# Patient Record
Sex: Male | Born: 1935 | Race: White | Hispanic: No | Marital: Married | Smoking: Former smoker
Health system: Southern US, Community
[De-identification: ages and names within clinical notes are randomized; demographics above are authoritative.]

## PROBLEM LIST (undated history)

## (undated) DIAGNOSIS — H34212 Partial retinal artery occlusion, left eye: Secondary | ICD-10-CM

## (undated) DIAGNOSIS — R5381 Other malaise: Secondary | ICD-10-CM

## (undated) DIAGNOSIS — G252 Other specified forms of tremor: Secondary | ICD-10-CM

## (undated) DIAGNOSIS — E785 Hyperlipidemia, unspecified: Secondary | ICD-10-CM

## (undated) DIAGNOSIS — I4589 Other specified conduction disorders: Secondary | ICD-10-CM

## (undated) DIAGNOSIS — K4020 Bilateral inguinal hernia, without obstruction or gangrene, not specified as recurrent: Principal | ICD-10-CM

## (undated) DIAGNOSIS — G20 Parkinson's disease: Secondary | ICD-10-CM

## (undated) DIAGNOSIS — C4490 Unspecified malignant neoplasm of skin, unspecified: Secondary | ICD-10-CM

## (undated) DIAGNOSIS — R5383 Other fatigue: Secondary | ICD-10-CM

## (undated) DIAGNOSIS — I351 Nonrheumatic aortic (valve) insufficiency: Secondary | ICD-10-CM

## (undated) DIAGNOSIS — I10 Essential (primary) hypertension: Secondary | ICD-10-CM

## (undated) DIAGNOSIS — J069 Acute upper respiratory infection, unspecified: Secondary | ICD-10-CM

## (undated) DIAGNOSIS — I453 Trifascicular block: Secondary | ICD-10-CM

## (undated) DIAGNOSIS — I4891 Unspecified atrial fibrillation: Secondary | ICD-10-CM

## (undated) HISTORY — DX: Other specified forms of tremor: G25.2

## (undated) HISTORY — DX: Acute upper respiratory infection, unspecified: J06.9

## (undated) HISTORY — DX: Partial retinal artery occlusion, left eye: H34.212

## (undated) HISTORY — DX: Unspecified malignant neoplasm of skin, unspecified: C44.90

## (undated) HISTORY — DX: Parkinson's disease: G20

## (undated) HISTORY — DX: Bilateral inguinal hernia, without obstruction or gangrene, not specified as recurrent: K40.20

## (undated) HISTORY — DX: Other malaise: R53.81

## (undated) HISTORY — DX: Other fatigue: R53.83

## (undated) HISTORY — DX: Essential (primary) hypertension: I10

## (undated) HISTORY — DX: Nonrheumatic aortic (valve) insufficiency: I35.1

## (undated) HISTORY — DX: Trifascicular block: I45.3

## (undated) HISTORY — DX: Hyperlipidemia, unspecified: E78.5

## (undated) HISTORY — DX: Other specified conduction disorders: I45.89

---

## 1992-05-06 DIAGNOSIS — C4490 Unspecified malignant neoplasm of skin, unspecified: Secondary | ICD-10-CM

## 1992-05-06 HISTORY — DX: Unspecified malignant neoplasm of skin, unspecified: C44.90

## 1996-03-20 DIAGNOSIS — G20 Parkinson's disease: Secondary | ICD-10-CM

## 1996-03-20 HISTORY — DX: Parkinson's disease: G20

## 1999-08-25 HISTORY — PX: OTHER SURGICAL HISTORY: SHX169

## 2000-04-08 ENCOUNTER — Encounter: Admission: RE | Admit: 2000-04-08 | Discharge: 2000-04-08 | Payer: Self-pay

## 2000-05-25 ENCOUNTER — Encounter (INDEPENDENT_AMBULATORY_CARE_PROVIDER_SITE_OTHER): Payer: Self-pay

## 2000-05-25 ENCOUNTER — Ambulatory Visit (HOSPITAL_COMMUNITY): Admission: RE | Admit: 2000-05-25 | Discharge: 2000-05-25 | Payer: Self-pay | Admitting: *Deleted

## 2000-08-30 ENCOUNTER — Encounter: Payer: Self-pay | Admitting: Internal Medicine

## 2000-08-30 ENCOUNTER — Encounter: Admission: RE | Admit: 2000-08-30 | Discharge: 2000-08-30 | Payer: Self-pay | Admitting: Internal Medicine

## 2002-02-17 ENCOUNTER — Encounter: Payer: Self-pay | Admitting: Specialist

## 2002-02-24 ENCOUNTER — Inpatient Hospital Stay (HOSPITAL_COMMUNITY): Admission: RE | Admit: 2002-02-24 | Discharge: 2002-03-01 | Payer: Self-pay | Admitting: Specialist

## 2002-02-24 HISTORY — PX: TOTAL KNEE ARTHROPLASTY: SHX125

## 2002-04-03 ENCOUNTER — Encounter: Admission: RE | Admit: 2002-04-03 | Discharge: 2002-04-26 | Payer: Self-pay | Admitting: Specialist

## 2002-07-17 ENCOUNTER — Encounter: Admission: RE | Admit: 2002-07-17 | Discharge: 2002-08-09 | Payer: Self-pay | Admitting: Specialist

## 2002-09-29 ENCOUNTER — Ambulatory Visit (HOSPITAL_COMMUNITY): Admission: RE | Admit: 2002-09-29 | Discharge: 2002-09-29 | Payer: Self-pay | Admitting: *Deleted

## 2002-09-29 ENCOUNTER — Encounter (INDEPENDENT_AMBULATORY_CARE_PROVIDER_SITE_OTHER): Payer: Self-pay | Admitting: Specialist

## 2003-02-20 ENCOUNTER — Inpatient Hospital Stay (HOSPITAL_COMMUNITY): Admission: RE | Admit: 2003-02-20 | Discharge: 2003-02-25 | Payer: Self-pay | Admitting: Specialist

## 2003-02-20 HISTORY — PX: REPLACEMENT TOTAL KNEE: SUR1224

## 2003-03-19 ENCOUNTER — Encounter: Admission: RE | Admit: 2003-03-19 | Discharge: 2003-05-28 | Payer: Self-pay | Admitting: Specialist

## 2003-07-19 ENCOUNTER — Ambulatory Visit (HOSPITAL_COMMUNITY): Admission: RE | Admit: 2003-07-19 | Discharge: 2003-07-19 | Payer: Self-pay | Admitting: Internal Medicine

## 2004-01-10 ENCOUNTER — Ambulatory Visit (HOSPITAL_COMMUNITY): Admission: RE | Admit: 2004-01-10 | Discharge: 2004-01-10 | Payer: Self-pay | Admitting: Cardiology

## 2004-03-10 ENCOUNTER — Ambulatory Visit (HOSPITAL_COMMUNITY): Admission: RE | Admit: 2004-03-10 | Discharge: 2004-03-10 | Payer: Self-pay | Admitting: Internal Medicine

## 2004-05-05 ENCOUNTER — Encounter: Admission: RE | Admit: 2004-05-05 | Discharge: 2004-05-05 | Payer: Self-pay | Admitting: *Deleted

## 2004-05-21 ENCOUNTER — Other Ambulatory Visit: Admission: RE | Admit: 2004-05-21 | Discharge: 2004-05-21 | Payer: Self-pay | Admitting: Diagnostic Radiology

## 2004-05-21 ENCOUNTER — Encounter: Admission: RE | Admit: 2004-05-21 | Discharge: 2004-05-21 | Payer: Self-pay | Admitting: *Deleted

## 2004-05-21 ENCOUNTER — Encounter (INDEPENDENT_AMBULATORY_CARE_PROVIDER_SITE_OTHER): Payer: Self-pay | Admitting: Specialist

## 2004-05-26 ENCOUNTER — Encounter: Admission: RE | Admit: 2004-05-26 | Discharge: 2004-05-26 | Payer: Self-pay | Admitting: Internal Medicine

## 2004-06-18 ENCOUNTER — Encounter: Admission: RE | Admit: 2004-06-18 | Discharge: 2004-06-18 | Payer: Self-pay | Admitting: Specialist

## 2004-08-08 ENCOUNTER — Ambulatory Visit: Payer: Self-pay | Admitting: Physical Medicine & Rehabilitation

## 2004-08-08 ENCOUNTER — Ambulatory Visit: Payer: Self-pay | Admitting: Internal Medicine

## 2004-08-08 ENCOUNTER — Encounter (INDEPENDENT_AMBULATORY_CARE_PROVIDER_SITE_OTHER): Payer: Self-pay | Admitting: Specialist

## 2004-08-08 ENCOUNTER — Inpatient Hospital Stay (HOSPITAL_COMMUNITY): Admission: RE | Admit: 2004-08-08 | Discharge: 2004-08-12 | Payer: Self-pay | Admitting: Orthopedic Surgery

## 2004-08-08 HISTORY — PX: REVISION TOTAL KNEE ARTHROPLASTY: SHX767

## 2004-08-12 ENCOUNTER — Inpatient Hospital Stay (HOSPITAL_COMMUNITY)
Admission: RE | Admit: 2004-08-12 | Discharge: 2004-08-15 | Payer: Self-pay | Admitting: Physical Medicine & Rehabilitation

## 2004-08-16 ENCOUNTER — Encounter (HOSPITAL_COMMUNITY): Admission: RE | Admit: 2004-08-16 | Discharge: 2004-09-25 | Payer: Self-pay | Admitting: Orthopedic Surgery

## 2004-09-18 HISTORY — PX: REVISION TOTAL KNEE ARTHROPLASTY: SHX767

## 2004-09-25 ENCOUNTER — Inpatient Hospital Stay (HOSPITAL_COMMUNITY): Admission: RE | Admit: 2004-09-25 | Discharge: 2004-09-29 | Payer: Self-pay | Admitting: Orthopedic Surgery

## 2004-10-19 ENCOUNTER — Inpatient Hospital Stay (HOSPITAL_COMMUNITY): Admission: EM | Admit: 2004-10-19 | Discharge: 2004-10-24 | Payer: Self-pay | Admitting: Emergency Medicine

## 2005-04-27 IMAGING — CR DG CHEST 2V
2 series · 2 of 2 positions shown · non-contrast
Comparison: 08/04/04.

CLINICAL DATA: 77-year-old for preop total knee replacement. 
 CHEST - 2 VIEW:

[view not recorded (1 of 2)]
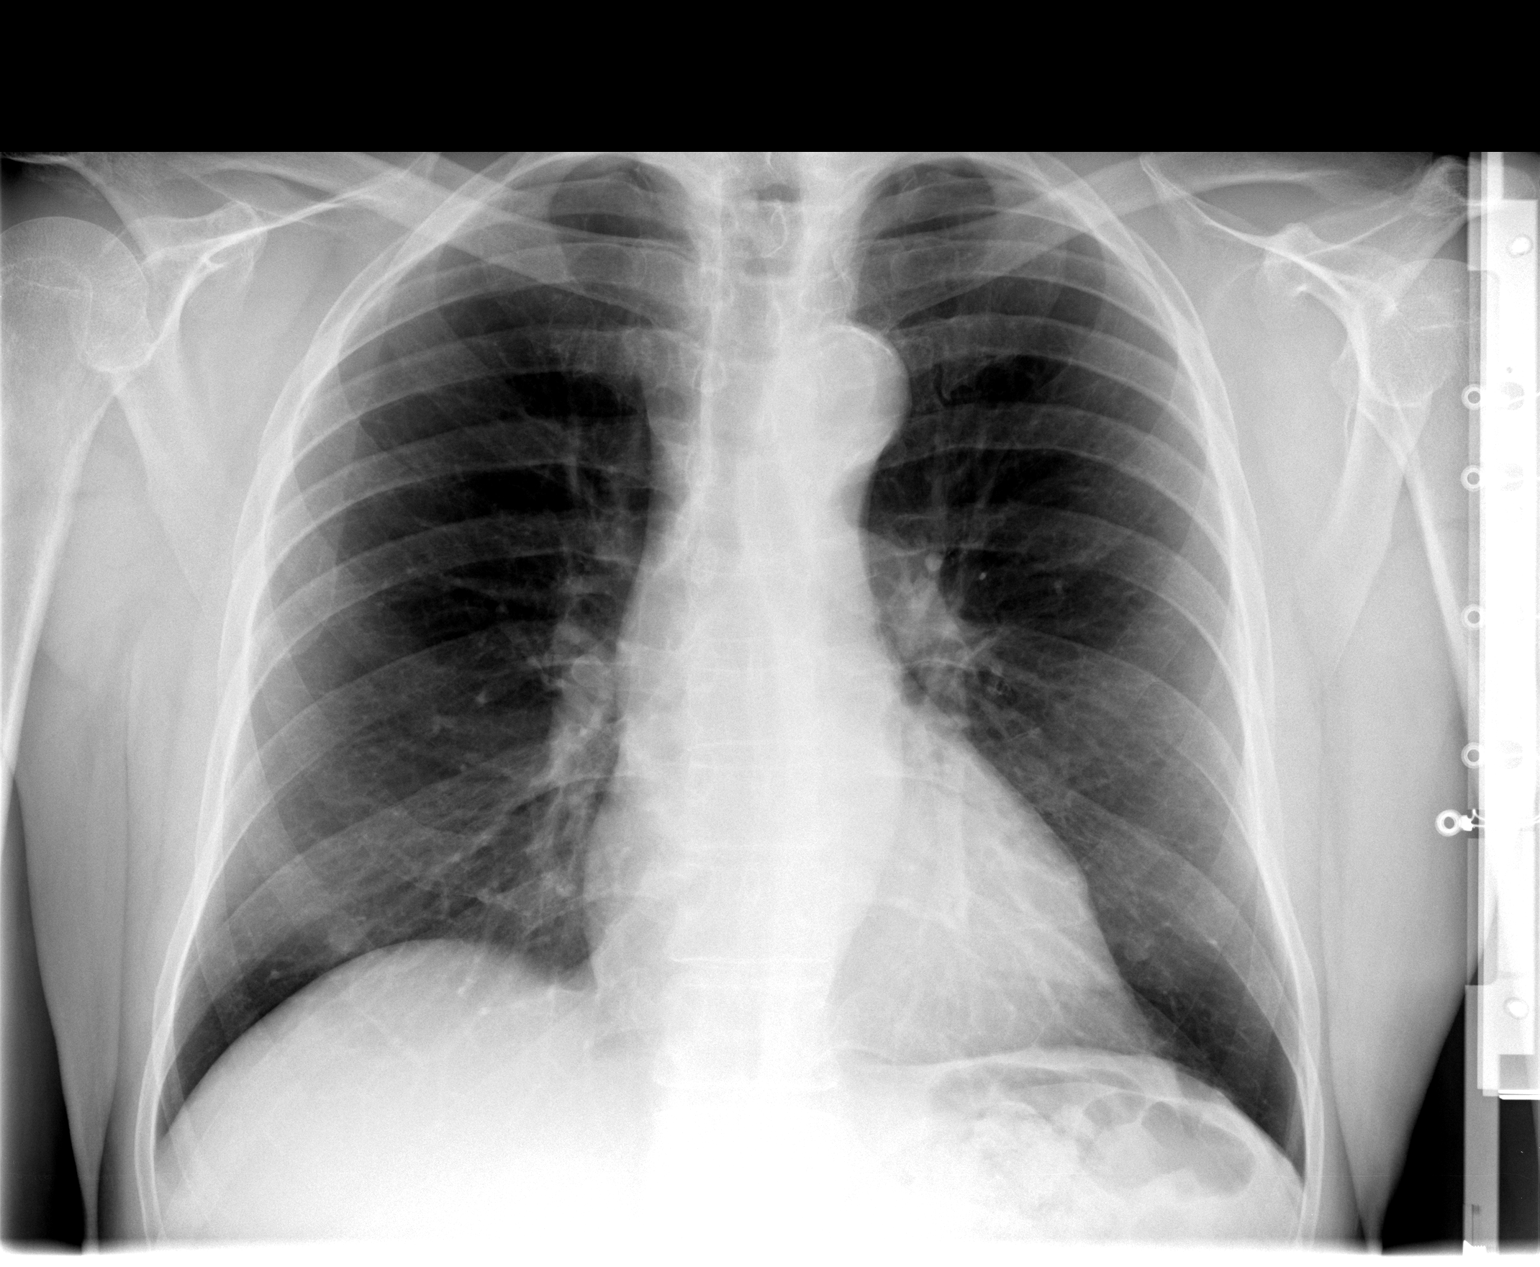

[view not recorded (2 of 2)]
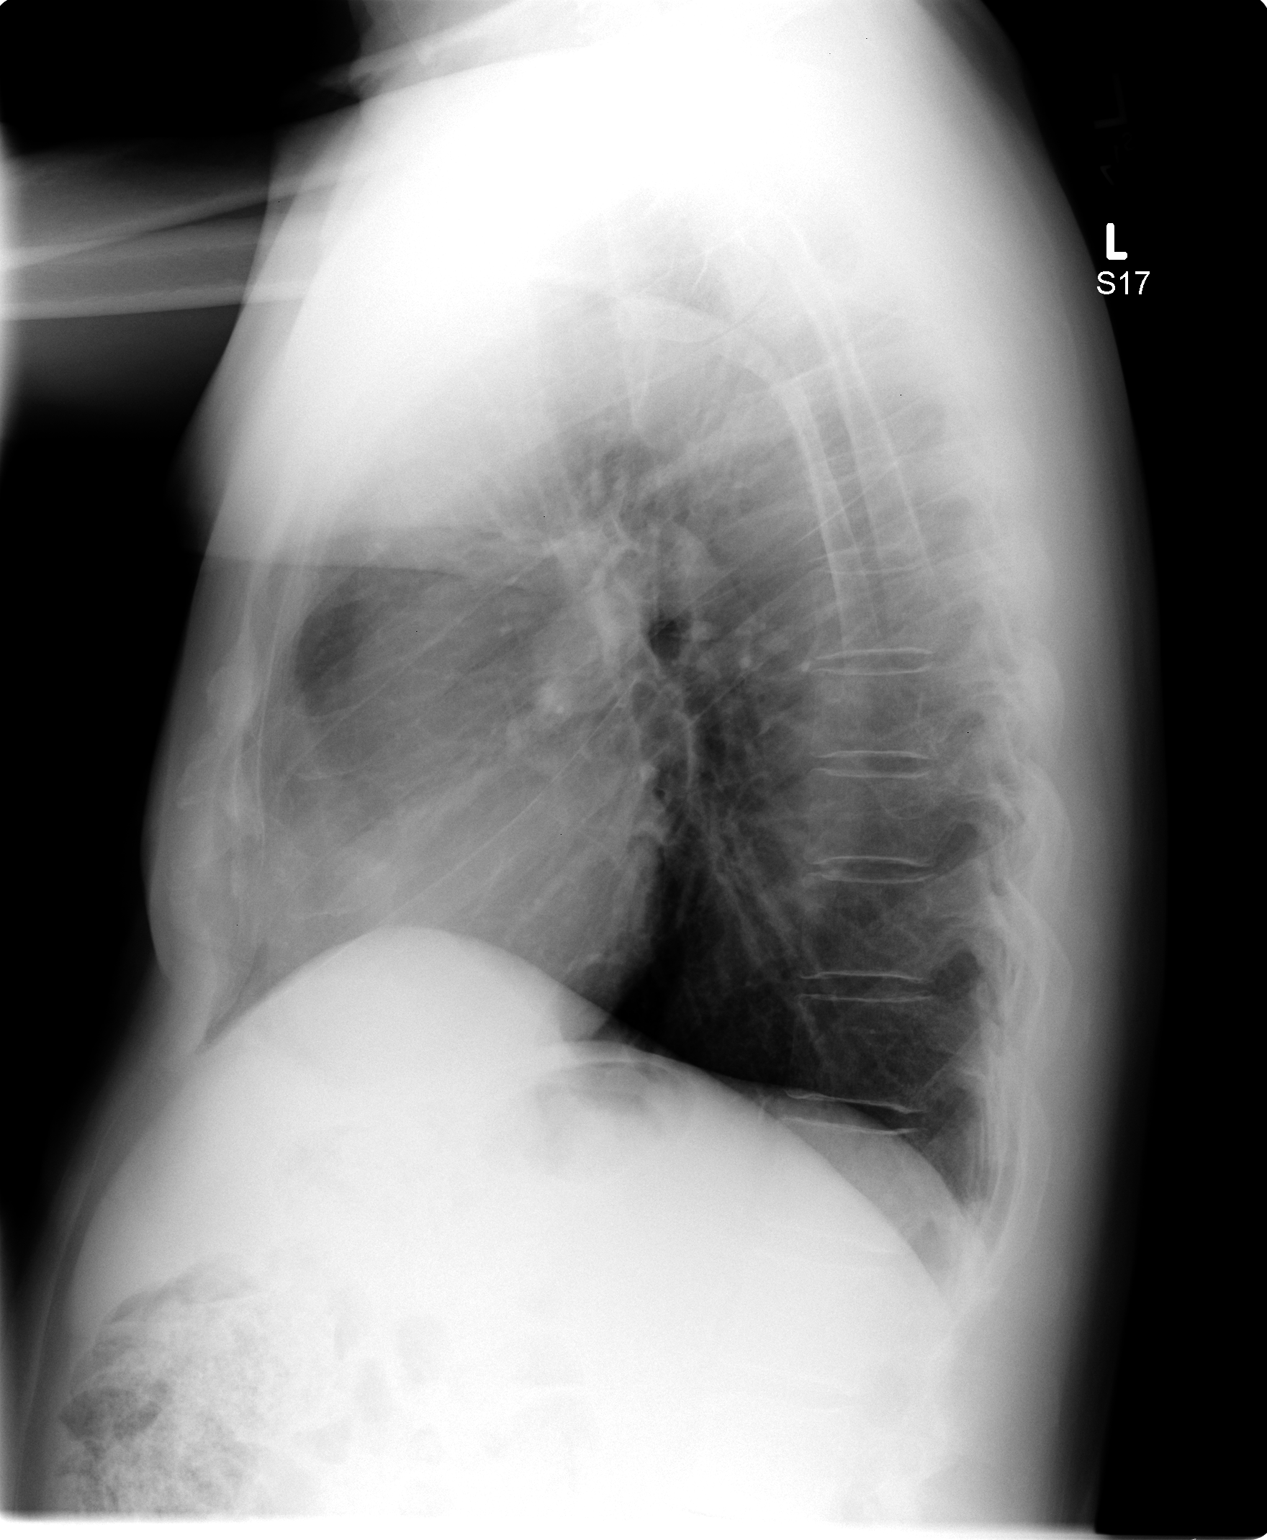

[2 of 2 positions shown; findings below may reference images not displayed]

Two views of the chest show the cardiac silhouette and mediastinal contours to be within normal limits and stable.  Lungs are clear.  Two rounded densities in the lower lung zones bilaterally are fairly symmetric and likely represent nipple shadows.
IMPRESSION: No acute cardiopulmonary findings.

## 2005-12-14 ENCOUNTER — Encounter (INDEPENDENT_AMBULATORY_CARE_PROVIDER_SITE_OTHER): Payer: Self-pay | Admitting: *Deleted

## 2005-12-14 ENCOUNTER — Ambulatory Visit (HOSPITAL_COMMUNITY): Admission: RE | Admit: 2005-12-14 | Discharge: 2005-12-14 | Payer: Self-pay | Admitting: *Deleted

## 2006-07-02 ENCOUNTER — Encounter: Admission: RE | Admit: 2006-07-02 | Discharge: 2006-07-02 | Payer: Self-pay | Admitting: Neurology

## 2006-12-01 ENCOUNTER — Ambulatory Visit (HOSPITAL_COMMUNITY): Admission: RE | Admit: 2006-12-01 | Discharge: 2006-12-01 | Payer: Self-pay | Admitting: *Deleted

## 2006-12-01 ENCOUNTER — Encounter (INDEPENDENT_AMBULATORY_CARE_PROVIDER_SITE_OTHER): Payer: Self-pay | Admitting: *Deleted

## 2008-03-06 HISTORY — PX: OTHER SURGICAL HISTORY: SHX169

## 2008-04-21 ENCOUNTER — Emergency Department (HOSPITAL_BASED_OUTPATIENT_CLINIC_OR_DEPARTMENT_OTHER): Admission: EM | Admit: 2008-04-21 | Discharge: 2008-04-21 | Payer: Self-pay | Admitting: Emergency Medicine

## 2010-09-02 NOTE — Op Note (Signed)
[name], FAILS NO.:  [id number]   MEDICAL RECORD NO.:  [id number]          PATIENT TYPE:  AMB   LOCATION:  ENDO                         FACILITY:  [facility]   PHYSICIAN:  Georgiana Spinner, M.D.    DATE OF BIRTH:  [birth year]   DATE OF PROCEDURE:  12/01/2006  DATE OF DISCHARGE:                               OPERATIVE REPORT   PROCEDURE:  Upper endoscopy with biopsy.   INDICATIONS:  Gastroesophageal reflux disease, rule out Barrett's  esophagus.   ANESTHESIA:  Fentanyl 50 mcg, Versed 5 mg.   PROCEDURE:  With the patient mildly sedated in the left lateral  decubitus position the Pentax videoscopic endoscope was inserted in the  mouth and passed under direct vision through the esophagus which  appeared normal except for one small area of Barrett's photographed and  biopsied.  We then entered into the stomach.  Fundus, body, antrum,  duodenal bulb, second portion duodenum all appeared normal.  From this  point the endoscope was slowly withdrawn taking circumferential views of  the duodenal mucosa until the endoscope had been pulled back in the  stomach, placed in retroflexion to view the stomach from below.  The  endoscope was straightened and withdrawn taking circumferential views of  remaining gastric and esophageal mucosa.  The patient's vital signs,  pulse oximeter remained stable.  The patient tolerated procedure well  without apparent complications.   FINDINGS:  One [address] of Barrett's esophagus biopsied.  Await  biopsy report.  The patient will call me for results and follow-up with  me as an outpatient.           ______________________________  Georgiana Spinner, M.D.     GMO/MEDQ  D:  12/01/2006  T:  12/02/2006  Job:  [id number]

## 2010-09-05 NOTE — Op Note (Signed)
   NAME:  [name], [name]                       ACCOUNT NO.:  [id number]   MEDICAL RECORD NO.:  [id number]                   PATIENT TYPE:  AMB   LOCATION:  ENDO                                 FACILITY:  [facility]   PHYSICIAN:  Georgiana Spinner, M.D.                 DATE OF BIRTH:  10/17/1926   DATE OF PROCEDURE:  DATE OF DISCHARGE:                                 OPERATIVE REPORT   PROCEDURE:  Colonoscopy.   INDICATIONS FOR PROCEDURE:  Colon polyps.   ANESTHESIA:  Demerol 20, Versed 2 mg.   DESCRIPTION OF PROCEDURE:  With the patient mildly sedated in the left  lateral decubitus position, the Olympus videoscopic colonoscope was inserted  in the rectum after a normal rectal exam and passed under direct vision to  the cecum identified by the ileocecal valve and appendiceal orifice both of  which were photographed. From this point, the colonoscope was slowly  withdrawn taking circumferential views of the entire colonic mucosa stopping  only in ascending colon just 1-2 folds removed from the ileocecal valve at  which point a polyp was seen and was seen to be oblong in shape and attached  by a short pedicle approximately 1 cm in size. this was photographed and  removed using snare cautery technique on a setting of 20:20 blended current.  Some residual polypoid tissue was also then snared off and also hot biopsy  forceps was used to remove the last of tissue and to cauterize the base.  There was good hemostasis. All of the tissue was retrieved for pathology.  The endoscope was then withdrawn taking circumferential views of the  remaining colonic mucosa stopping only then in the rectum which appeared  normal on direct and retroflexed view.  The endoscope was straightened and  withdrawn. The patient's vital signs and pulse oximeter remained stable. The  patient tolerated the procedure well without apparent complications.   FINDINGS:  Diverticulosis of the sigmoid colon moderately severe, also  polyp  of ascending colon removed. Await biopsy report. The patient will call me  for results and followup with me as an outpatient.                                               Georgiana Spinner, M.D.    GMO/MEDQ  D:  09/29/2002  T:  09/29/2002  Job:  [id number]

## 2010-09-05 NOTE — H&P (Signed)
NAMEJAXON, FLATT NO.:  [id number]   MEDICAL RECORD NO.:  [id number]          PATIENT TYPE:  INP   LOCATION:  3025                         FACILITY:  [facility]   PHYSICIAN:  Lonia Blood, M.D.      DATE OF BIRTH:  Dec 22, 1926   DATE OF ADMISSION:  10/19/2004  DATE OF DISCHARGE:                                HISTORY & PHYSICAL   PHYSICIANS:  Primary care physician: Janae Bridgeman. Lendell Caprice, M.D.  Neurologist:  Genene Churn. Love, M.D.  Orthopedic surgeon: Madlyn Frankel. Charlann Boxer, M.D.   PRESENTING COMPLAINT:  Intermittent delirium and falls.   HISTORY OF PRESENT ILLNESS:  The patient is a 77 year old with history of  coronary artery disease, Parkinson's disease, as well as status post knee  replacement surgery bilaterally with a recent revision within a month.  The  patient was brought in by family secondary to falling twice yesterday.  Over  the past three to four days, the patient has been acting weirdly and very  restless and tremulous.  He apparently went to bed yesterday, and somehow in  the middle of the night fell out of bed.  He was found on the floor around 6  a.m. today.  The patient has been in and out of consciousness, sometimes  very sharp with excellent memory; however, he goes into episodes of delirium  where he is shaking uncontrollably and not communicating adequately.  He  denied any fever, nausea, vomiting, or diarrhea.  Per family, his knee has  been completely swollen, and he has been complaining of pain there. He just  completed 10 days of oral Keflex.   PAST MEDICAL HISTORY:  1.  Parkinson's disease.  2.  Severe arthritis status post right total knee replacement in July 2003      which was reviewed in June 2006.  3.  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  4.  History of coronary artery disease status post PTCA in 1988.  5.  GERD.  6.  Hypertension.  7.  Diverticulosis with episode of diverticulitis.  8.  Dyslipidemia.  9.  Normocytic  anemia.   MEDICATIONS:  1.  Methocarbamol 500 mg 1 tablet q.6h. p.r.n. for muscle spasm.  2.  Coumadin 5 mg daily prescribed for DVT prophylaxis after total knee      replacement.  He is to take it up until October 25, 2004.  3.  Vicodin 5/500 one to two tablets q.4-6h. p.r.n.  4.  Nexium 40 mg daily.  5.  Dilacor XR 120 mg daily.  6.  Amantadine 100 mg p.o. t.i.d.   ALLERGIES:  The patient is allergic to CODEINE SULFATE.   SOCIAL HISTORY:  The patient lives in [place] with his wife in a one-  level home.  Prior to this episode, he was able to walk with a cane.  He had  no history of tobacco or alcohol use.  He has other assistance from family  members including daughter who lives nearby.   FAMILY HISTORY:  Noncontributory at this point.   REVIEW OF SYSTEMS:  A 10-point Review of Systems was attempted.  The patient  is unable to give adequate history but mainly complained as indicated.   PHYSICAL EXAMINATION:  VITAL SIGNS:  The patient is afebrile with a  temperature of 98.3, blood pressure 121/67, pulse 76, respiratory rate 22,  O2 saturation 97% on room air.  Orthostatic blood pressure also checked, and  the patient was not orthostatic.  GENERAL:  Tremulous which is episodic.  In between, the patient is  communicating well and has very sharp memory.  He is in no acute distress.  HEENT:  Pupils equal, round, and reactive to light.  The patient wears  glasses.  NECK:  Supple, no JVD, no lymphadenopathy, no carotid bruit.  RESPIRATORY:  Good air entry bilaterally with no wheezes or rales.  CARDIOVASCULAR:  Regular rate and rhythm.  ABDOMEN: Soft, nontender, with positive bowel sounds.  EXTREMITIES:  No clubbing, cyanosis, or edema.  NEUROLOGIC:  Extensive examination was done by Dr. Sandria Manly in my presence.  Cranial nerves II-XII intact.  The patient has good motor function but has  cogwheel rigidity as expected.  He has both intention and resting tremors  but more intention than  rest tremors.  Reflexes are 2+ bilaterally.  He has  relatively normal coordination except for the tremors as indicated.  His  gait, however, is affected.  The patient has been falling backward which is  consistent with retropulsion.  But this could be caused by Parkinson's  disease among other causes.   LABORATORY DATA:  His labs at this point showed a white count of 6.5,  hemoglobin 11.7.  Previous hemoglobin 12.1 in June 2006.  Platelet count is  338, normal differential.  Sodium 132, potassium 4, CO2 26, glucose 96, BUN  24, creatinine 0.9, calcium 8.8, total protein 6.7, albumin 3.8, AST 18, ALT  12, alkaline phosphatase 141, and total bilirubin 0.4.  UA is clean.  PT  19.1, INR 1.6.   Head CT without contrast is negative for any acute illness.   ASSESSMENT:  This is a 77 year old gentleman with sudden delirium, also has  had falls probably secondary to effects of delirium.  The patient also has  had recent surgery and has markedly swollen right knee where he had the knee  replacement.  It was warm to touch, tender, and looks like it may have been  infected.  He just finished a dose of Keflex, however.  It seems to me like  the issue of delirium is drug related rather than anything else.  The most  likely drug could be a combination of amantadine and methocarbamol probably  potentiating the effects.  Other possibilities are amantadine alone which is  a toxic drug, but if it is toxic to the patient or if he is withdrawing from  the drug.  At this time, there is no way of telling.  I have asked Dr. Sandria Manly,  therefore, to assist [place] in determining the likely source of patient's  delirium.  Other possibilities could be a myocardial infarction with his  history of coronary artery disease, but patient has no chest pain and no  real EKG changes.  His EKG shows merely normal sinus rhythm with some  evidence of left axis deviation, right bundle branch block, but this is not  acute.  PLAN:   1.  Delirium:  Will admit the patient to monitored bed.  We will check the      dose of his amantadine as part of first recommendation.  Will start with  the liquid form at 15 mg t.i.d.  If we do not have the liquid form in      the hospital, will switch to 100 mg b.i.d. to start.  This is because we      cannot stop the amantadine cold [place].  I will also stop Robaxin that      he is taking at this point.  Will see if his mentation improves with      these new changes.  I will also get PT and OT involved with the patient      and hope they help him with his gait.  I suspect his knee issue is also      going to be a problem, and I will try and address that as well.  2.  Right knee pain and swelling.  I am worried that patient may have had      more infection than we originally thought.  Currently he is doing okay      on the workup when we stood him up except for the retropulsion that I      mentioned.  I will CT his knee since we cannot do an MRI to see if maybe      there is a fluid collection there that may be consistent with an      abscess.  He is not febrile at this point, and I do will not imagine      workup.  I will not imagine any further workup like blood cultures at      this point.  There is no discharge from the area, although the patient      had serous discharge apparently two days ago.  If patient spikes a fever      or if there is no improvement, I will consider adding antibiotic while      he is in the hospital.  Meanwhile, I will continue pan culture with      Vicodin.  3.  Gastroesophageal reflux disease.  I will start him on Nexium which he      has been taking and probably switch to Protonix in the hospital.  4.  Coronary artery disease.  This seems to be stable; however, if he is      having myocardial infarction, he could have altered mental status with      delirium.  I will, therefore, check his cardiac enzymes serially to rule      out myocardial infarction  at this stage.  5.  Anemia.  The patient's hemoglobin is stable close to where his discharge      hemoglobin was.  It was apparently postoperative anemia.  6.  Hypertension.  I will continue with Diltiazem at this stage.  His blood      pressure seems to be controlled now.       LG/MEDQ  D:  10/19/2004  T:  10/19/2004  Job:  [id number]   cc:   Janae Bridgeman. Eloise Harman., M.D.  [address]  [place]  [place] [address]  Fax: [phone]   Genene Churn. Love, M.D.  [address]  [address]  [place]  [place] [address]  Fax: [phone]   Madlyn Frankel. Charlann Boxer, M.D.  [facility]  [address]  [place] [address]  [place]  [place] [address]  Fax: [phone]

## 2010-09-05 NOTE — Discharge Summary (Signed)
[name], [name] NO.:  [id number]   MEDICAL RECORD NO.:  [id number]          PATIENT TYPE:  IPS   LOCATION:  4039                         FACILITY:  [facility]   PHYSICIAN:  Ellwood Dense, M.D.   DATE OF BIRTH:  [birth year]   DATE OF ADMISSION:  08/12/2004  DATE OF DISCHARGE:  08/15/2004                                 DISCHARGE SUMMARY   DISCHARGE DIAGNOSES:  1.  Status post right knee revision secondary to infection, August 08, 2004.  2.  Intravenous vancomycin x6 weeks.  3.  Coumadin for deep venous thrombosis prophylaxis.  4.  Anemia.  5.  Parkinson's disease.  6.  Hypertension.  7.  Gastroesophageal reflux disease.  8.  Hyperlipidemia.  9.  Coronary artery disease with percutaneous transluminal coronary      angioplasty.  10. Left total knee replacement, November, 2004.   HISTORY OF PRESENT ILLNESS:  This is a 77 year old white male with history  of right total knee replacement February 24, 2002, who was admitted to [facility] on April 21 with right knee pain. Aspirin of knee was  negative. Sedimentation rate 40, felt to be aseptic loosening, question  infectious process. He underwent right knee resection of right total knee  replacement on August 08, 2004 per Dr. Charlann Boxer. Placed on Coumadin for deep  venous thrombosis prophylaxis; touch down weight bearing. He was fitted with  a locked knee hinged brace. Infectious disease follow up, placed on  intravenous vancomycin x6 weeks.  A PICC line had been placed August 11, 2004. He was admitted for  comprehensive rehabilitation program.   PAST MEDICAL HISTORY:  See discharge diagnoses.   ALLERGIES:  CODEINE.   SOCIAL HISTORY:  He lives with his wife in a 2-level home, bedroom  downstairs. Wife can assist on discharge.   MEDICATIONS PRIOR TO ADMISSION:  1.  Nexium 40 mg daily.  2.  Amantadine 100 mg t.i.d.  3.  Dilacor XR 120 mg daily.  4.  Mobic 15 mg daily.   HOSPITAL COURSE:  The patient did  well while on rehabilitation services with  therapies initiated on a b.i.d. basis. The following issues were followed  during patient's rehab course:   1.  Pertaining to Mr. Jutte, right knee resection secondary to infection      August 08, 2004. Surgical site healing nicely with dressing changes per      Dr. Durene Romans. Neurovascular sensation intact. Touch down weight      bearing to right lower extremity. He has been fitted with a locked      hinged knee brace as per orthopedic services. He remained on intravenous      vancomycin x6 weeks. A PICC line had been placed for access of      antibiotics. He would follow up with infectious disease, Dr. Lenn Sink. Arrangements had been made for patient to be followed by a      nurse at home for administering antibiotic care. He remained on Coumadin      for deep venous thrombosis prophylaxis; latest INR of 3.0.  He would      receive Home Health Nursing to complete Coumadin protocol.   1.  Postoperative anemia. Hemoglobin of 9.6. He remained on iron supplement.  2.  His Parkinson's disease was without issue during his rehabilitation      stay. He remained on Amantadine t.i.d. He will follow up with Dr. Sandria Manly,      neurology services as advised.  3.  His blood pressures remained controlled with diastolic pressures 60 to      75. He continued with his Cardizem as advised.  4.  GI:  He had no bowel or bladder disturbances throughout his rehab      course.  5.  Noted history of left total knee replacement November, 2004, this again      was without issue during his rehab course. The patient was ambulating      extended household distances with a walker at time of discharge.  Full      family teaching completed. Home Health therapies will be provided as      advised per rehab services.   LATEST LABS:  Showed an INR of 3.0. Sodium 137, potassium 3.3, BUN 16,  creatinine 0.8. Hemoglobin 9.6, hematocrit 27.6, platelets 331,000, WBC  of  6000.   He was discharged to home.   DISCHARGE MEDICATIONS AT TIME OF DICTATION:  1.  Coumadin, latest dose of 7.5 mg, this will be adjusted on the day of      discharge, to be completed on Sep 07, 2004.  2.  Amantadine 100 mg t.i.d.  3.  Dilacor 120 mg daily.  4.  Protonix 40 mg daily.  5.  Trinsicon 1 capsule b.i.d.  6.  Vicodin p.r.Johnny. pain.  7.  Intravenous vancomycin x6 weeks total from the date of August 08, 2004;      to be followed up per Dr. Durene Romans of orthopedic services.   ACTIVITY:  Touch down weight bearing to right lower extremity with locked  knee brace.   SPECIAL INSTRUCTIONS:  Home Health nurse for administration intravenous  vancomycin antibiotic. Home Health nurse to complete Coumadin protocol.      DA/MEDQ  D:  08/14/2004  T:  08/14/2004  Job:  [id number]   cc:   Madlyn Frankel Charlann Boxer, M.D.  [facility]  [address]  [place] [address]  [place]  [place] [address]  Fax: [phone]   Rockey Situ. Flavia Shipper., M.D.  [address]  [place]  [place] [address]  Fax: [phone]   Janae Bridgeman. Eloise Harman., M.D.  [address] [place] [address]  [place]  [place] [address]  Fax: [phone]   Genene Churn. Love, M.D.  [address]  [address]  [place]  [place] [address]  Fax: [phone]

## 2010-09-05 NOTE — Discharge Summary (Signed)
NAMENYRON, MOZER NO.:  [id number]   MEDICAL RECORD NO.:  [id number]          PATIENT TYPE:  INP   LOCATION:  0467                         FACILITY:  [facility]   PHYSICIAN:  Madlyn Frankel. Charlann Boxer, M.D.  DATE OF BIRTH:  [birth year]   DATE OF ADMISSION:  08/08/2004  DATE OF DISCHARGE:  08/12/2004                                 DISCHARGE SUMMARY   ADMISSION DIAGNOSES:  1.  Failed right total knee replacement arthroplasty.  2.  Coronary artery disease, history of cardiac catheterization.  3.  History of angina.  4.  Gastroesophageal reflux disease.  5.  Parkinson disease.  6.  Diverticulosis.  7.  Hemorrhoids.  8.  Osteoarthritis.  9.  Bilateral cataracts with replacements.   DISCHARGE DIAGNOSES:  1.  Failed right total knee replacement arthroplasty.  2.  Coronary artery disease, history of cardiac catheterization.  3.  History of angina.  4.  Gastroesophageal reflux disease.  5.  Parkinson disease.  6.  Diverticulosis.  7.  Hemorrhoids.  8.  Osteoarthritis.  9.  Bilateral cataracts with replacements.  10. Mild postoperative anemia.   CONSULTATIONS:  Dr. Roxan Hockey and Dr. Cliffton Asters of infectious disease;  Dr. Thomasena Edis of [facility].   OPERATION:  On August 08, 2004, the patient underwent resection arthroplasty  of right total knee replacement arthroplasty with cement spacer impregnated  with tobramycin and vancomycin spacer.   BRIEF HISTORY:  This is a 77 year old gentleman with loose knee arthroplasty  stemming from knee replacement from 2003. It is felt that he had aseptic  loosening of the knee due to negative aspiration performed by Dr. Tressie Ellis in  our office and CRPL capsule was 7.7. Sed rate was 40. This gentleman had  extreme difficulty getting about and, as one would expect, increased pain in  the knee. It was felt that he would benefit from surgical intervention and  is being admitted for the above procedure.   COURSE IN THE HOSPITAL:  The  patient was kept on vancomycin postoperatively,  allowed touchdown weightbearing on the operative extremity. Dr. Cliffton Asters saw the patient for infectious disease, agreed with the vancomycin  to continue. Dr. Roxan Hockey also saw the patient after cultures were received  and those fortunately were sterile. He recommended continuing IV vancomycin.   Neurovascular remained intact to the operative extremity. It was relatively  stable. It was felt that he would need some intensive inpatient  rehabilitation, so as to provide him confidence and instruction on what to  do as far as using his good leg for ambulation, getting about, ADLs. The  best place for this was in [facility] and so we, after Dr. Thomasena Edis'  approval, transferred him to [facility].   Laboratory values in the hospital showed a preoperative CBC with mild  anemia, hemoglobin 11.9, hematocrit 35.7. Final hemoglobin was 10 with a  hematocrit of 29.8. Blood chemistries were essentially normal. He had some  very mild hyponatremia, however this certainly is not symptomatic with  sodium of 133.  Synovial fluid cell count showed the fluid to be dark,  yellow, turbid, white count  of 250,000, and neutrophils were 84. The gram  stain showed abundant wbc's but no organisms.  No growth times two days and  no anaerobes were isolated. Urinalysis was negative for urinary tract  infection. The frozen section showed increased neutrophilic cells in a  nonfibrous area with giant cell reaction. Chest x-ray, which was performed  after PICC line, was placed and showed the PICC tip in the inferior aspect  of the superior vena cava. No electrocardiogram or other chest x-ray seen on  this chart.   CONDITION ON DISCHARGE:  Improved and stable.   PLAN:  The patient is to continue with the vancomycin. Will certainly follow  Dr. Orvan Falconer and Dr. Danella Penton lead as far as maintaining this gentleman's  antibiotic protocol. He is to continue with  touchdown weightbearing and to  continue with Coumadin protocol for the prevention of DVT which was started  directly postoperatively. We will follow him along with the physicians at  [facility].      DLU/MEDQ  D:  08/21/2004  T:  08/21/2004  Job:  [id number]   cc:   Cliffton Asters, M.D.  [address] [place]  [place] [address]  Fax: [phone]

## 2010-09-05 NOTE — Discharge Summary (Signed)
[name], MCKIM NO.:  [id number]   MEDICAL RECORD NO.:  [id number]          PATIENT TYPE:  INP   LOCATION:                               FACILITY:  [facility]   PHYSICIAN:  Elliot Cousin, M.D.    DATE OF BIRTH:  Sep 21, 1926   DATE OF ADMISSION:  10/19/2004  DATE OF DISCHARGE:  10/24/2004                                 DISCHARGE SUMMARY   DISCHARGE DIAGNOSES:  1.  Delirium thought to be secondary to medications.  2.  Falls x2 at home, no head trauma.  3.  Parkinson's disease.  4.  Status post resection of the right knee replacement, reimplantation of      right total knee replacement per Dr. Charlann Boxer on [id number]. The patient had      recently been treated for a local cellulitis of the right knee with      Keflex per Dr. Charlann Boxer.  5.  History of coronary artery disease status post percutaneous transluminal      coronary angioplasty in 1988.  6.  Hypertension.  7.  History of diverticulitis.  8.  Mild hypokalemia.  9.  Normocytic anemia.  10. History of dyslipidemia   DISCHARGE MEDICATIONS:  1.  Amantadine 50/5 mL, 5 mL (50 mg) t.i.d.  2.  Nexium 40 mg daily.  3.  Dilacor XR 120 mg daily.  4.  Vicodin. 5 mg q.6 h p.r.n.  5.  Potassium chloride 20 mEq daily for 7 more days.  6.  Ferrous sulfate once daily.  7.  Multivitamin with iron once daily.  8.  DISCONTINUE Robaxin.  9.  DISCONTINUE Coumadin.   CONSULTATIONS:  1.  Dr. Avie Echevaria  2.  Dr. Durene Romans   PROCEDURE PERFORMED:  CT scan of the head on July2,2006. The results  revealed no CT evidence for acute intracranial abnormality and no  intracranial mass lesions; age-related white matter changes.   HISTORY OF PRESENT ILLNESS:  The patient is a 77 year old man with a past  medical history significant for coronary artery disease, Parkinson's  disease, and status post right total knee replacement in June, who presented  to the emergency department with a chief complaint of falls and confusion.  According to the family, the patient had been somewhat restless and  tremulous. He experienced several episodes of visual hallucinations. He had  been becoming increasingly confused over the past few days leading up to  hospitalization.   New medications over the past few weeks included Coumadin, Keflex, Nexium,  and Robaxin. Per the family, there had been no confusion in the new  medications given; the patient's wife fills his pill box once weekly. The  patient has no history of alcohol or drug abuse. The patient was, therefore,  admitted for further evaluation and management. For additional details  please see the dictated history and physical.   HOSPITAL COURSE BY PROBLEMS:  Problem #1: DELIRIUM.  The patient was  admitted to the neuro floor on 3000. Neuro checks were instituted for the  first 24 hours. Investigational studies were ordered to evaluate the  patient's obvious delirium. The results of  the studies are as follows, a CT  scan of the head was completely negative for acute intracranial changes. The  TSH was within normal limits at 3.103. The vitamin B12 was within normal  limits at 312. The RPR was nonreactive. The RBC folate was within normal  limits at 650. Cardiac enzymes were ordered x1 and were completely negative.  Urinalysis was completely negative.   Neurologist, Dr. Sandria Manly, was consulted for further evaluation and management.  Per Dr. Imagene Gurney assessment, he suspected either a medication error or side  effects from the amantadine.. Therefore, the amantadine was decreased from  100 mg t.i.d. to 50 mg t.i.d.  Dr. Sandria Manly did start Sinemet, but then decided  to discontinue it during the hospital course. Over the course of the first  24-48 hours, the patient had multiple episodes of confusion and agitation.  He required restraints and a couple of doses of Haldol. Following the Haldol  administration, the following day, he was somewhat lethargic. The Haldol was  subsequently  discontinued and the patient was started on Seroquel 25 mg in  the morning and 50 mg in the evening. Eventually the patient's delirium  subsided. The Seroquel was titrated down to only 25 mg q.h.s.  It was  eventually discontinued at the time of hospital discharge. Prior to hospital  discharge, the patient's delirium completely resolved. The possible etiology  of the delirium was the higher dose of amantadine at 100 mg t.i.d. As stated  above, the dose was decreased during the hospital course. Per Dr. Imagene Gurney  recommendation, the amantadine will continue at 50 mg t.i.d. There was no  need to continue Seroquel at the time of hospital discharge. The patient was  also advised to discontinue the Robaxin as well.   Problem #2:  PARKINSON'S DISEASE.  As above, the patient was discharged to  home on Amantadine 50 mg t.i.d.. Further management and follow up by Dr.  Sandria Manly per his schedule.   Problem #3:  STATUS POST RESECTION OF TOTAL RIGHT KNEE REPLACEMENT AND  REIMPLANTATION OF RIGHT TOTAL KNEE REPLACEMENT per Dr. Charlann Boxer [id number]. Dr.  Charlann Boxer was consulted for further management of the recent right knee  operation. Dr. Charlann Boxer recommended discontinuing the Coumadin therapy which had  been started 3-1/2 weeks prior. However, prior to Dr. Nilsa Nutting assessment, the  patient was restarted on Coumadin and his INR was assessed daily. Dr. Charlann Boxer  recommended continued physical therapy as previously ordered during hospital  course. The patient will continue outpatient physical therapy on the right  knee as prescribed by Dr. Charlann Boxer. The patient ambulated during the hospital  course with physical therapy with the aid of a walker and did very well. The  patient was advised to discontinue the Coumadin.   Problem #4:  MILD HYPOKALEMIA.  The patient's serum potassium ranged between  3.4 and 3.5 during the hospitalization. He was given IV fluids during the hospital course which may have contributed to the mild reduction in  serum  potassium. The patient was repleted with potassium chloride by mouth. At the  time of hospital discharge his serum potassium was 3.5. He was advised to  continue potassium chloride 20 mEq daily for the next 7 days and then stop.  Further evaluation and management per Dr. Lendell Caprice his primary care  physician.   Problem #5:  HYPERTENSION.  The patient's blood pressures were fairly well-  controlled during hospital course. He was maintained on Dilacor XR 120 mg  daily.   DISCHARGE LABORATORIES:  Sodium  136, potassium 3.5, chloride 104, CO2 26,  glucose 99, BUN 14, creatinine 1.0, total bilirubin 0.7, alkaline  phosphatase 123, SGOT 13, SGPT 14, total protein 6.7, albumin 3.3, calcium  8.6, INR 2.5.       DF/MEDQ  D:  10/27/2004  T:  10/27/2004  Job:  [id number]   cc:   Janae Bridgeman. Eloise Harman., M.D.  [address]  [place]  [place] [address]  Fax: [phone]   Genene Churn. Love, M.D.  [address]  [address]  [place]  [place] [address]  Fax: [phone]   Madlyn Frankel. Charlann Boxer, M.D.  [facility]  [address]  [address]  [place]  [place] [address]  Fax: [phone]

## 2010-09-05 NOTE — Op Note (Signed)
NAME:  [name], [name] NO.:  [id number]   MEDICAL RECORD NO.:  [id number]                   PATIENT TYPE:  INP   LOCATION:  0462                                 FACILITY:  [facility]   PHYSICIAN:  Erasmo Leventhal, M.D.         DATE OF BIRTH:  May 19, 1926   DATE OF PROCEDURE:  DATE OF DISCHARGE:                                 OPERATIVE REPORT   PREOPERATIVE DIAGNOSES:  Right knee end-stage osteoarthritis.   POSTOPERATIVE DIAGNOSES:  Right knee end-stage osteoarthritis.   PROCEDURE:  Right total knee arthroplasty.   SURGEON:  Erasmo Leventhal, M.D.   ASSISTANT:  Lianne Cure, P.A.   ANESTHESIA:  Spinal epidural, Dr. Shireen Quan.   ESTIMATED BLOOD LOSS:  Less than 50 cc.   DRAINS:  Two medium Hemovacs.   COMPLICATIONS:  None.   TOURNIQUET TIME:  1 hour and 35 minutes at 375 mmHg.   DISPOSITION:  PACU stable.   OPERATIVE IMPLANTS:  Osteonics components posterior stabilized , all  cemented. A size 9 femur, size 9 tibia, 12 mm polyethylene insert, Flex  Epi______ type. A 26 mm patella.   DESCRIPTION OF PROCEDURE:  The patient was counseled in the holding area,  correct side was identified, chart was reviewed and signed appropriately, IV  started, antibiotics given. Taken to the operating room and a spinal  epidural was administered. The patient in the supine position, Foley  catheter placed utilizing sterile technique by the OR circulating nurse. The  right knee was examined and found to have a 5 degree flexion contracture, he  could flex to 125 degrees. He was elevated, he was prepped with Duraprep and  draped in sterile fashion. Exsanguinated with Esmarch, tourniquet was  inflated to 375 mmHg.   A straight midline incision was made through the skin and subcutaneous  tissue, small bleeders electrocoagulated and then utilized soft tissue flaps  to develop the appropriate level. Medial parapatellar arthrotomy was  performed and  proximal medial tibial soft tissue release was done. The knee  was then flexed, patella everted. End-stage osteoarthritic change, bone on  bone contact, cruciate ligaments were resected. A starting hole made in the  distal femur, canal was irrigated, intermedullary rod was gently placed,  there was a 5 degree valgus cut and a 10 mm cut off the distal femur was  taken and the distal femur was found to be a size #9. Rotation marks were  made and it was fit to cut a size #9. Medial and lateral menisci removed,  geniculate vessels were coagulated. For access his tibial osteophytes were  removed, tibial eminence was resected, proximal tibia was found to be a size  #9. A step drill was utilized to enter the intermedullary canal was  irrigated until the effluent was clear. The intermedullary rod was gently  placed. I chose a 5 degree posterior slope with a 10 mm cut placed upon the  lateral side. This cut was then  made and found to be in excellent position.  Posteromedial and posterolateral femoral osteophytes were removed under  direct visualization. The femoral trochlea was prepared in standard fashion.  At this point in time with a size 9 femur and a size 9 tibia, a 12 mm insert  with excellent range of motion and soft tissue balance, rotation marks were  made in the proximal tibia and delta keel was performed in the standard  fashion. The patella was found to be a size 26 and was reamed to the  appropriate depth and locking holes were made. At this point, the knee was  then copiously irrigated with pulsatile lavage. At this point utilizing  modern cement technique, all components were cemented in place. A size 9  tibia, size 9 femur, 26 mm patella. After the cement had cured, a 12 mm  tibial insert had excellent range of motion and soft tissue balance, the  trial was removed and final implant was placed. Bone wax was placed to  expose bony surfaces, geniculate vessels were coagulated,  hemostasis was  obtained. Patella tracking was lateral even with towel clips on the patella  and a lateral release was performed trying to preserve the synovium to keep  the barrier intact, hemostasis obtained. The knee was irrigated with  antibiotic solution during the closure as were soft tissue planes.  Sequential closure of layers was done, arthrotomy with Vicryl, subcu Vicryl,  skin closed with subcuticular Monocryl suture. Steri-Strips were applied.  Two medium Hemovac drains were placed prior to closure. This was then hooked  to suction, sterile drapes applied to the knee. Tourniquet was deflated. He  had normal circulation in foot and ankle at the end of the case. There were  no complications. Another gram of Ancef was given at end of the case,  tourniquet deflated. He was then taken from the operating room to PACU in  stable condition. There were no complications. Sponge and needle count were  correct.  To decrease surgical time and help with traction throughout the entire case,  Ms. Thomasena Edis' assistance was needed.                                               Erasmo Leventhal, M.D.    RAC/MEDQ  D:  02/24/2002  T:  02/24/2002  Job:  [id number]

## 2010-09-05 NOTE — Op Note (Signed)
   NAMEBURNS, TIMSON                       ACCOUNT NO.:  [id number]   MEDICAL RECORD NO.:  [id number]                   PATIENT TYPE:  AMB   LOCATION:  ENDO                                 FACILITY:  [facility]   PHYSICIAN:  Georgiana Spinner, M.D.                 DATE OF BIRTH:  [birth year]   DATE OF PROCEDURE:  09/29/2002  DATE OF DISCHARGE:                                 OPERATIVE REPORT   PROCEDURE:  Upper endoscopy with biopsy.   INDICATIONS FOR PROCEDURE:  Gastroesophageal reflux disease.   ANESTHESIA:  Demerol 50, Versed 5 mg.   DESCRIPTION OF PROCEDURE:  With the patient mildly sedated in the left  lateral decubitus position, the Olympus videoscopic endoscope was inserted  in the mouth and passed under direct vision through the esophagus and there  was a question of Barrett's esophagus photographed and subsequently  biopsied. We entered into the stomach. The fundus, body, antrum, duodenal  bulb and second portion of the duodenum all appeared normal. From this  point, the endoscope was slowly withdrawn taking circumferential views of  the duodenal mucosa until the endoscope was then pulled back in the stomach,  placed in retroflexion to view the stomach from below. The endoscope was  straightened and withdrawn taking circumferential views of the remaining  gastric and esophageal mucosa. Biopsy of the distal esophagus negative for  Barrett's esophagus. The patient's vital signs and pulse oximeter remained  stable. The patient tolerated the procedure well without apparent  complications.   FINDINGS:  Question of short segment Barrett's esophagus biopsied. Await  biopsy report. The patient will call me for results and followup with me as  an outpatient. Proceed to colonoscopy as planned.                                               Georgiana Spinner, M.D.    GMO/MEDQ  D:  09/29/2002  T:  09/29/2002  Job:  [id number]

## 2010-09-05 NOTE — H&P (Signed)
[name], [name] NO.:  [id number]   MEDICAL RECORD NO.:  [id number]          PATIENT TYPE:  INP   LOCATION:  0009                         FACILITY:  [facility]   PHYSICIAN:  Madlyn Frankel. Charlann Boxer, M.D.  DATE OF BIRTH:  May 17, 1926   DATE OF ADMISSION:  09/25/2004  DATE OF DISCHARGE:                                HISTORY & PHYSICAL   PRINCIPAL DIAGNOSIS:  Status post right total knee resection arthroplasty  for infection, now for reimplantation.   SECONDARY DIAGNOSES:  1.  Hypertension.  2.  Diverticulitis.  3.  Gastroesophageal reflux disease.  4.  Hyperlipidemia.  5.  History of Parkinson's disease.  6.  Coronary artery disease.   HISTORY OF PRESENT ILLNESS:  [name] is a very pleasant 76 year old  white male who was kindly referred for evaluation by Dr. Hayden [name] for a  painful right total knee replacement.  There was concern by bone scan that  the patient had a loose tibial tray.  Infection workup included lab results  that were somewhat equivocal, although not totally benign.  Bone scan had  indicated loose tibial tray with some mild loosening of the femur.  At the  time the patient was taken to the operating room with plans for revision and  total knee replacement, but also discussion about infection.  When I removed  his components initially, per his initial surgical procedure, which was  performed in the middle of April, 2006.  His bone quality was noted to be  extremely poor, very soft with massive defects.  I was concerned about a  subclinical infection.  For that reason, he was treated as a two-stage  infection management.  He had resection arthroplasty and placement of  antibiotic spacer.  He has subsequently finished six weeks of antibiotics on  September 19, 2004.  At this point, he is ready for reimplantation of right total  knee replacement.  Cultures were all negative.  Infectious disease was  consulted for management.   ALLERGIES:   CODEINE.   CURRENT MEDICATIONS:  Dilacor XR.  Nexium.  Amantidine.   PAST MEDICAL HISTORY:  1.  Hypertension.  2.  Diverticulitis.  3.  Gastroesophageal reflux disease.  4.  Hyperlipidemia.  5.  Parkinson's disease.  6.  Coronary artery disease.   PAST SURGICAL HISTORY:  Bilateral total knee replacements.  His right knee  replacement was performed in November, 2003.  His left total knee  replacement was done in November, 2004.   FAMILY HISTORY:  Noncontributory.   REVIEW OF SYSTEMS:  Patient has been healthy.  Despite this whole ordeal, he  has not had any fever, chills, or night sweats.  No problems with his wound  healing.   PHYSICAL EXAMINATION:  VITAL SIGNS:  Temperature 97.7, pulse 76, blood  pressure 150/84.  GENERAL:  He is awake, alert, cooperative, extremely pleasant disposition.  He does have a slight resting tremor.  HEENT/NECK:  Normal.  No evidence of any asymmetry to the facial  musculature.  His neck is supple with no palpable nodules.  No nodes.  He  has no bruits.  LUNGS:  Clear to auscultation bilaterally.  HEART:  Regular rate and rhythm.  No murmur detected.  ABDOMEN:  Soft and nontender. Positive bowel sounds.  EXTREMITIES:  The incision over his right leg is well healed.  He has  limited motion based on the cement spacers applied.  The patellar mobility  is minimal.  He has palpable pulses intact to motor and sensory function  distally.  Skin is overall intact.  No signs of cellulitis.   X-rays revealed status post resection arthroplasty noted.  No new films have  been ordered.   IMPRESSION:  Status post infected right total knee replacement.   PLAN:  Patient is to go to the OR for same-day admission on September 25, 2004 for  reimplantation, right total knee replacement.  Risks and benefits were  reviewed in the office.  The patient is eager to proceed with this.  At this  point, based on the subclinical infection and treatment with IV antibiotics  for a  period of six weeks, patient is ready for reimplantation.  Questions  were encouraged and answered, and preparation for surgery to be performed.       MDO/MEDQ  D:  09/25/2004  T:  09/25/2004  Job:  [id number]

## 2010-09-05 NOTE — Op Note (Signed)
[name], HEMMELGARN NO.:  [id number]   MEDICAL RECORD NO.:  [id number]          PATIENT TYPE:  AMB   LOCATION:  ENDO                         FACILITY:  [facility]   PHYSICIAN:  Georgiana Spinner, M.D.    DATE OF BIRTH:  December 01, 1926   DATE OF PROCEDURE:  12/14/2005  DATE OF DISCHARGE:                                 OPERATIVE REPORT   PROCEDURE:  Colonoscopy.   INDICATIONS:  Colon polyps.   ANESTHESIA:  Fentanyl 50 mcg, Versed 3.5 mg.   PROCEDURE:  With the patient mildly sedated in the left lateral decubitus  position the Olympus videoscopic colonoscope was inserted into the rectum  after normal rectal exam and passed under direct vision to cecum, identified  by ileocecal valve and appendiceal orifice both which were photographed.  From this point colonoscope was slowly withdrawn taking circumferential  views of colonic mucosa stopping to photograph diverticula seen along the  way mostly in the sigmoid colon until we reached the rectum which appeared  normal on direct and showed hemorrhoids on retroflexed view. The endoscope  was straightened and withdrawn.  The patient's vital signs, pulse oximeter  remained stable.  The patient tolerated procedure well without apparent  complications.   FINDINGS:  Diverticulosis of sigmoid colon.  Internal hemorrhoids. Otherwise  unremarkable colonoscopic examination to the cecum.   PLAN:  Repeat examination in 5 years Of note, preoperative ampicillin and  gentamicin were given prior to commencement of these procedures.           ______________________________  Georgiana Spinner, M.D.     GMO/MEDQ  D:  12/14/2005  T:  12/14/2005  Job:  [id number]

## 2010-09-05 NOTE — H&P (Signed)
NAME:  Johnny, [name] NO.:  [id number]   MEDICAL RECORD NO.:  [id number]                   PATIENT TYPE:  INP   LOCATION:  NA                                   FACILITY:  [name]   PHYSICIAN:  Johnny Leventhal, MD           DATE OF BIRTH:  [birth year]   DATE OF ADMISSION:  02/24/2002  DATE OF DISCHARGE:                                HISTORY & PHYSICAL   CHIEF COMPLAINT:  Pain in the right knee.   PRESENT ILLNESS:  A 77 year old white male seen by Dr. Thomasena [name] with  progressing problems concerning his right knee.  He is a very active  gentleman.  Both of his knees are giving him problems but they are more so  on the right than the left.  He has had osteofibrotic debridement to that  knee as well as three Synvisc injections and takes Celebrex.  Unfortunately,  he has come to the point where it is necessary to go ahead with total knee  replacement osteoplasty.  After much consideration and asking all questions  the surgery was scheduled. Examination of the knee revealed crepitus on  range of motion.  He has lost 5 degrees of full extension and can only flex  to 110 degrees.   The patient has been cleared by Dr. Dimas [name] for this surgical  procedure.  He underwent a stress EKG in January and did well with that.   PAST MEDICAL HISTORY:  This gentleman has been in relatively good health  throughout his lifetime.  He does have Parkinson on the right arm.  Gastroesophageal reflux disease.  Hyperlipidemia.  He has had a squamous  cell skin cancer removed in the past.   PAST SURGICAL HISTORY:  1. Heart catheterization by Dr. Charolette [name] in March 1989.  2. Lens removal with lens implant of the right eye in May 2001.  3. Arthroscopic surgery to the right knee in August 2001.   CURRENT MEDICATIONS:  1. Dilacor XR 240 mg one q.d.  2. Mirapex 1.5 mg one t.i.d.  3. Nexium 40 mg one q.d.  4. Zocor 20 mg Mondays, Wednesdays, Fridays.  5. Zocor 40  mg Tuesdays, Thursday, Saturday.  6. Celebrex 200 mg one q.d.  7. Aspirin 81 mg one q.d.  8. Vitamin C two a day.  9. Glucosamine chondroitin two a day.  10.      Coenzyme four times a day with vitamin E 300 units four times a     day.  11.      Garlic.  12.      Zinc.  13.      Folic acid.  14.      Vitamin B-complex.   He will stop the Celebrex, aspirin, vitamin C, glucosamine chondroitin and  coenzyme and vitamin E as well as the garlic prior to surgery.   ALLERGIES:  He is allergic to CODEINE.   SOCIAL HISTORY:  The patient is married.  He is retired.  He is an avid  fisherman.  He has no intake of tobacco products.  He has a glass of wine  with his evening meal daily.  He has five children and his wife, [name], will  help him after the surgery.   REVIEW OF SYSTEMS:  CENTRAL NERVOUS SYSTEM:  No seizures, total paralysis,  no double vision but does have a tremor to his right hand and forearm  consistent with Parkinson.  CARDIOVASCULAR:  No chest pain. No angina or  orthopnea. RESPIRATORY:  No productive cough.  No hemoptysis. No shortness  of breath.  GASTROINTESTINAL:  No nausea, vomiting, melena, ____________  stools.  GENITOURINARY:  No discharge, dysuria or hematuria.  MUSCULOSKELETAL:  Primarily in present illness.   PHYSICAL EXAMINATION:  Alert, cooperative, friendly, 77 year old white male  looking somewhat younger than his stated age.  VITAL SIGNS:  Blood pressure 122/70.  Pulse 72.  Respirations 12.  HEENT:  Normocephalic. Pupils equal, round, reactive to light and  accommodation.  Extraocular movements intact.  Oropharynx clear.  CHEST:  Clear to auscultation.  No rhonchi.  No rales.  HEART:  Regular rate and rhythm.  No murmurs are heard.  ABDOMEN:  Soft, nontender. Liver and spleen not felt.  GENITALIA/RECTAL:  Not done.  No part of present illness.  EXTREMITIES:  Right knee as in present illness above.   ADMISSION DIAGNOSES:  1. Endstage osteoarthritis right  knee.  2. Parkinson.  3. Hyperlipidemia.  4. Gastroesophageal reflux disease.  5. History of coronary artery disease.   [name]:  The patient will undergo right total knee replacement arthroplasty.  Plans for him to be home after physical therapy utilizing [name].  He will also be on Coumadin protocol for three to four weeks after  surgery.  Should we have any medical problems in the [name] we  will  certainly call Dr. Higinio [name] and one of his associates to follow along  with [place].  o     [name], P.A.             Johnny Leventhal, MD    DLU/MEDQ  D:  02/16/2002  T:  02/16/2002  Job:  [id number]   cc:   [name]. [name]., M.D.  [address]  [place]  [place] [address]  Fax: [phone]

## 2010-09-05 NOTE — Discharge Summary (Signed)
NAME:  [name], [name] NO.:  [id number]   MEDICAL RECORD NO.:  [id number]                   PATIENT TYPE:  INP   LOCATION:  0462                                 FACILITY:  [facility]   PHYSICIAN:  Erasmo Leventhal, M.D.         DATE OF BIRTH:  03-28-1927   DATE OF ADMISSION:  02/24/2002  DATE OF DISCHARGE:  03/01/2002                                 DISCHARGE SUMMARY   ADMISSION DIAGNOSES:  1. End-stage osteoarthritis right knee.  2. Parkinson's.  3. Hyperlipidemia.  4. Gastroesophageal reflux disease.  5. History of coronary artery disease.   DISCHARGE DIAGNOSES:  1. End-stage osteoarthritis right knee, status post right total knee     arthroplasty.  2. Parkinson's.  3. Hyperlipidemia.  4. Gastroesophageal reflux disease.  5. History of coronary artery disease.   PROCEDURE:  The patient was taken to the operating room on February 24, 2002,  and underwent a right total knee arthroplasty by Dr. Benny Lennert.  Assistant was [employer].  The surgery was done under spinal  epidural.  Hemovac x 2 was placed at the time of surgery.   CONSULTATIONS:  1. Anesthesia for epidural management.  2. Pharmacy for Coumadin management.  3. Physical therapy.  4. Occupational therapy.  5. Physical medicine and rehabilitation with Dr. Thomasena Edis.  6. Social work.  7. Case management.   BRIEF HISTORY:  The patient is a 77 year old male seen by Dr. Thomasena Edis with  progressive problems concerning his right knee.  He is a very active  gentleman.  Both of his knees are giving him problems, but moreso on the  right than the left.  He had osteofibrotic debridement to that knee as well  as three Synvisc injections and takes Celebrex.  Unfortunately, he has come  to the point where it is necessary to go ahead with total knee replacement.  After much consideration of options, surgery was scheduled.  Examination of  the knee revealed crepitus on range of  motion.   LABORATORY DATA:  CBC on admission revealed a hemoglobin and hematocrit of  14.3 and 41.5, white blood cell count 6.3, red blood cell count 4.35.  Serial H&Hs were followed throughout hospital stay and at the time of  discharge hemoglobin and hematocrit were stable at 10.4 and 29.5.,  differential on admission all within normal limits.  Coagulation studies on  admission all within normal limits.  At the time of discharge PT and INR  were 25.3 and 2.8 respectively on Coumadin therapy.  Routine chemistry on  admission was all within normal limits.  Serial chemistries were followed  throughout the stay.  Sodium did decline to 132 on February 26, 2002;  however, was back to 135 on February 27, 2002.  Glucose remained slightly  high in the range between 131 and 134 between November 8 and February 27, 2002.  Urinalysis on admission showed cloudy appearance.  The patient's  blood  type was A positive with antibody screen negative.   EKG on admission revealed sinus bradycardia with sinus arrhythmia.  Junctional ST depression probably normal with borderline ECG.   Radiographs on admission of the chest revealed mild cardiomegaly with no  active lung disease.  Radiographs of the right knee prior to admission  revealed degenerative changes, primarily medial, with no acute abnormality.   HOSPITAL COURSE:  The patient was admitted to [facility] and taken to the  operating room.  He underwent the above-stated procedure without  complications.  The patient tolerated the procedure well and was later  returned to the recovery room and then the orthopedic floor for continued  postoperative care.  On postoperative day #1 the patient was doing well.  Had no major complaints.  Epidural was intact and in working order.  The  patient was on bed rest due to the epidural.  On postoperative day #2 the  patient was doing fairly well.  Had some minor complaints of pain, as  expected.  Hemovac was  discontinued on this day.  Dressing was changed on  this day.  The wound was clean, dry, and intact.  PT was to get the patient  out of bed on this day, February 27, 2002.  On postoperative day #3 the  patient was resting comfortably.  Had no complaints.  Was continuing to  progress well with physical therapy.  Physical medicine and rehabilitation  consult was also received on this day, and it was thought best for the  patient to do home health physical therapy.  On February 28, 2002,  postoperative day #4 the patient was resting comfortably.  Was complaining  of no bowel movement.  Fleet enema prior the day before.  Was progressing  well with physical therapy and occupational therapy and was planned to be  discharged the next day.  On March 01, 2002, postoperative day #5 the  patient was doing well.  Had bowel movement the previous day.  Incision was  clean, dry, and intact.  The patient was ready for discharge.   DISPOSITION:  The patient was discharged home on March 01, 2002.   DISCHARGE MEDICATIONS:  1. Percocet 5/325 #40, 1-2 p.o. q.4-6h. p.r.n. pain.  2. Robaxin 500 mg #40, 1 p.o. q.8h. p.r.n. pain.  3. Colace 100 mg #60, 1 p.o. b.i.d.  4. Coumadin per pharmacy protocol.   DIET:  As tolerated.   ACTIVITY:  Total knee precautions.  Weightbearing as tolerated.  Genevieve Norlander for  home care and home CPM management.   FOLLOW-UP:  The patient is to follow up 2-1/2 to three weeks from the day of  surgery.  He is to call the office for an appointment.   CONDITION ON DISCHARGE:  Stable and improved.     Clarene Reamer, P.A.-C.                   Erasmo Leventhal, M.D.    SW/MEDQ  D:  03/01/2002  T:  03/01/2002  Job:  [id number]

## 2010-09-05 NOTE — Op Note (Signed)
NAMELUCIOUS, ZOU NO.:  [id number]   MEDICAL RECORD NO.:  [id number]          PATIENT TYPE:  AMB   LOCATION:  ENDO                         FACILITY:  [facility]   PHYSICIAN:  Georgiana Spinner, M.D.    DATE OF BIRTH:  30-May-1926   DATE OF PROCEDURE:  12/14/2005  DATE OF DISCHARGE:                                 OPERATIVE REPORT   PROCEDURE:  Upper endoscopy.   INDICATIONS:  GERD with Barrett's esophagus.   ANESTHESIA:  Fentanyl 25 mcg, Versed to 2.5 mg.   PROCEDURE:  With the patient mildly sedated in the left lateral decubitus  position the Olympus videoscopic endoscope was inserted in the mouth and  passed under direct vision through the esophagus which appeared normal until  we reached distal esophagus and there was change of Barrett's photographed  and biopsied.  We entered into the stomach. Fundus, body, antrum, duodenal  bulb, second portion duodenum were visualized. From this point the endoscope  was slowly withdrawn taking circumferential views of duodenal mucosa until  the endoscope had been pulled back into the stomach placed in retroflexion  to view the stomach from below. The endoscope was straightened and withdrawn  taking circumferential views remaining gastric and esophageal mucosa.  The  patient's vital signs, pulse oximeter remained stable.  The patient  tolerated procedure well without apparent complications.   FINDINGS:  Question of Barrett's esophagus and a [address] of tissue.  Await biopsy report.  The patient will call me for results and follow-up  with me as an outpatient.  Proceed to colonoscopy as planned.           ______________________________  Georgiana Spinner, M.D.     GMO/MEDQ  D:  12/14/2005  T:  12/14/2005  Job:  [id number]

## 2010-09-05 NOTE — Discharge Summary (Signed)
NAMEWILLOUGHBY, DOELL NO.:  [id number]   MEDICAL RECORD NO.:  [id number]          PATIENT TYPE:  INP   LOCATION:  1518                         [facility]ACILITY:  [facility]   PHYSICIAN:  Madlyn Frankel. Charlann Boxer, M.D.  DATE OF BIRTH:  [birth year]   DATE OF ADMISSION:  09/25/2004  DATE OF DISCHARGE:  09/29/2004                                 DISCHARGE SUMMARY   ADMITTING DIAGNOSES:  1.  Status post infected right total knee replacement arthroplasty.  2.  Hypertension.  3.  Diverticulitis.  4.  Gastroesophageal reflux disease.  5.  Hyperlipidemia.  6.  Parkinson's.  7.  Coronary artery disease.   DISCHARGE DIAGNOSES:  1.  Status post infected right total knee replacement arthroplasty.  2.  Hypertension.  3.  Diverticulitis.  4.  Gastroesophageal reflux disease.  5.  Hyperlipidemia.  6.  Parkinson's.  7.  Coronary artery disease.  8.  Postoperative anemia and hyponatremia (treated).   OPERATION:  On September 25, 2004 the patient underwent right total knee  replacement arthroplasty revision with reimplantation of cemented right  total knee replacement arthroplasty, Erasmo Leventhal, M.D. assisted.   BRIEF HISTORY:  A 77 year old gentleman was referred through the courtesy of  Dr. Hayden Rasmussen for what seemed to be a loose right tibial tray. He  previously had bilateral total knee replacements. The patient was taken to  the operating room for revision of the tibial component but the bone quality  under the tibial component was soft, abnormal and it was thought there was  an infection there. Six weeks of antibiotics were given. Cultures  fortunately were all negative. After six weeks, he was ready for the above  procedure.   HOSPITAL COURSE:  The patient tolerated the surgical procedure quite well,  he was certainly aware of the things that needed to be done as far as his  activities were concerned. He is placed on Coumadin protocol postoperatively  for prevention of DVT.  Pharmacy followed him very closely. He worked with  physical therapy. It was noted that he had a postoperative anemia 8.3 with  transfusion it was brought up to 9.60. Also noted to have a mild  hyponatremia 133, this was treated by restricted fluids and saline IV. On  the day of discharge, he was stable, his sodium was up and he was awake,  alert, stable, feeling quite good and anxious to go home.   The right knee wound was clean, dry, no drainage. Staples were intact.  Neurovascular was intact to the right lower extremity and calf was soft.  PICC line which he had since his beginning of antibiotics and retained was  removed prior to discharge. He is to be discharged home, continue home  medications and diet.   LABORATORY VALUES:  Laboratory values in the hospital hematologically showed  a preoperative CBC with a mild anemia. Hemoglobin was 12.11 with hematocrit  36.1, white count and differential completely within normal limits. Final  hemoglobin 9.6 with a hematocrit of 28.0 feet. Blood chemistry  preoperatively was normal with a sodium 137. This dropped to 133 during his  hospitalization  and after treatment his sodium was 137, potassium 3.7.  Urinalysis negative for urinary tract infection. No chest x-ray or  electrocardiogram seen on this chart.   CONDITION ON DISCHARGE:  Improved, stable.   PLAN:  The patient discharged to his home in the care of his family to  continue with home health, weightbearing as tolerated to the operative  extremity. He will return to see Dr. Charlann Boxer about two weeks after date of  surgery.   DISCHARGE MEDICATIONS:  Trinsicon 1 b.i.d. x1 month, Vicodin for pain,  Robaxin as a muscle relaxant, Coumadin per pharmacy for four weeks after  date of surgery. He had a BM prior to surgery, so no stool softener is  needed but I do  recommend that he have over-the-counter stool softener  daily as he will be on Trinsicon.       DLU/MEDQ  D:  09/29/2004  T:   09/29/2004  Job:  [id number]   cc:   Janae Bridgeman. Eloise Harman., M.D.  [address]  [place]  [place] [address]  Fax: [phone]

## 2010-09-05 NOTE — H&P (Signed)
NAMEISAMU, TRAMMEL NO.:  [id number]   MEDICAL RECORD NO.:  [id number]          PATIENT TYPE:  INP   LOCATION:  0467                         FACILITY:  [facility]   PHYSICIAN:  Madlyn Frankel. Charlann Boxer, M.D.  DATE OF BIRTH:  1926-05-29   DATE OF ADMISSION:  08/08/2004  DATE OF DISCHARGE:  08/12/2004                                HISTORY & PHYSICAL   PRINCIPAL DIAGNOSIS:  Failed right total knee replacement.   SECONDARY DIAGNOSES:  1.  A history of right total knee replacement in November, 2003.  2.  A history of coronary catheterization secondary to coronary artery      disease.  3.  A history of angina.  4.  A history of gastroesophageal reflux disease.  5.  Parkinson's disease.  6.  Diverticulitis.  7.  Hemorrhoids.  8.  Osteoarthritis.  9.  Cataracts.  10. Right knee arthroscopy.  11. A history of fibular fracture in 1997.   PAST SURGICAL HISTORY:  1.  Right total knee replacement in 2003.  2.  A history of heart catheterization in 1988.  3.  Cataract surgery in 2001.  4.  Arthroscopic knee surgery in 2001.  5.  Left total knee replacement in November, 2004.   FAMILY MEDICAL HISTORY:  Coronary artery disease, breast cancer, kidney  disease, stroke, arthritis.   CURRENT MEDICATIONS:  Dilacor, famotidine, Nexium, Mobic, aspirin.   DRUG ALLERGIES:  CODEINE.   REVIEW OF SYSTEMS:  Otherwise unremarkable other than increasing discomfort  to the left knee over the past several months.   PHYSICAL EXAMINATION:  GENERAL APPEARANCE:  Mr. Haque was awake, alert  and oriented at the time of the exam.  He is awake, alert and oriented at  rest.  He does have a resting tremor consistent with his diagnosis of  Parkinson's.  This is not a significant tremor.  VITAL SIGNS:  His temperature was 97.5, blood pressure 114/64.  He had a  pulse of 78.  NECK:  Supple with negative bruit.  He has no nodes noted.  HEENT:  His facial nerves are all intact.  CHEST:  Clear to  auscultation bilaterally.  HEART:  Regular rate and rhythm.  No murmur detected.  ABDOMEN:  Soft and nontender with positive bowel sounds.  EXTREMITIES:  Examination of his right knee reveals he has full knee  extension.  He notes today a 2-cm area of swelling over the anterior medial  aspect of the knee, and he questioned what this was.  To me this appeared to  be a fluid collection.  Otherwise he had palpable pulse, intact motor and  sensory functions.  There is no erythema.  His knee did feel warm on exam,  consistent with swelling and some inflammatory process.  Otherwise he was  neurovascularly intact in bilateral lower extremities.   Labs were pending  for preoperative state and had previous lab work  indicating a sed rate of 40, a CRP of 7 and a negative knee aspirate.   X-rays indicated a loose right total knee replacement including a bone scan.   IMPRESSION:  A failed right  total knee replacement.   PLAN:  We reviewed with Mr. Danowski his current situation.  Based on his  lab studies, we are expecting this to be primarily a loose tibial tray with  plans for revision of right total knee replacement.  I have reviewed with  him the concerns as a joint surgeon the possibility of infection within the  knee despite the findings.  I do have some concerns regarding this patient  with the fact that he has had increasing pain over the past month or so.  For this reason, we will be taking fluid samples and tissue samples, all to  evaluate for potential acute inflammation and also evaluate this anterior  medial aspect, which I feel is swelling versus inflammatory tissue.  He will  be admitted for same-day surgery, August 08, 2004.  Consent will be obtained  for revision of right total knee replacement. Questions were encouraged,  answered and reviewed in all spectrums of potential pathology.      MDO/MEDQ  D:  08/14/2004  T:  08/14/2004  Job:  [id number]

## 2010-09-05 NOTE — Op Note (Signed)
[name], [name] NO.:  [id number]   MEDICAL RECORD NO.:  [id number]          PATIENT TYPE:  INP   LOCATION:  0467                         FACILITY:  [facility]   PHYSICIAN:  Madlyn Frankel. Charlann Boxer, M.D.  DATE OF BIRTH:  Nov 09, 1926   DATE OF PROCEDURE:  08/08/2004  DATE OF DISCHARGE:                                 OPERATIVE REPORT   PREOPERATIVE DIAGNOSIS:  Failed right total knee replacement loosening.   POSTOPERATIVE DIAGNOSIS:  Infected right total knee replacement.   FINDINGS:  There was an extensive amount of fibrinous and infected-appearing  tissue within the right knee.  The clinical appearance of this knee did not  appear to be normal.  Aseptic loosening.  There were extensive erosive  changes to both the femoral and tibial bone surfaces and for a knee  replacement that had been present for only 2 years, this would not be  expected.   PROCEDURE:  Right resection arthroplasty of right total knee replacement.   SURGEON:  Dr. Charlann Boxer   ASSISTANT:  Dr. Worthy Rancher   ANESTHESIA:  General.   BLOOD LOSS:  100 mL.   TOURNIQUET TIME:  2 hours at 300 mmHg.   DRAINS:  One.   INDICATION FOR PROCEDURE:  [name] is a very pleasant 77 year old  gentleman, who has presented on referral evaluation for a loose knee.  The  patient was noted to have a knee replacement in November 2003.  He had had a  work-up by Dr. Noel [name] that was extremely adequate, including aspiration of  the knee which was negative and sed rate which was 40 and a CRPL capsule of  7.7.  Given the clinical picture that was present, it was felt this was due  to aseptic loosening.  On review, the possibility of infection was carried  out.   Upon review of this and plan for determined total knee revision with a plan  to obtain cultures and STAT evaluation to rule out infection, consent was  obtained.   PROCEDURE IN DETAIL:  The patient was brought to the operative theatre.  Once adequate  anesthesia and preoperative antibiotics, 1 g of Ancef were  administered, the patient was positioned supine on the operating table.  The  proximal thigh tourniquet was placed.  A midline incision was made.  Medial  parapatellar arthrotomy was made and on the inferior medial aspect of the  incision, there was noted to be a significant amount of granulation,  fibrinous tissue.  This already on initial evaluation was concerning for an  infective process.  Further, upon completing the medial patellar arthrotomy,  the joint fluid was noted to be cloudy to clear, but no overt and obvious  purulence.  It was immediately obvious upon opening and exposing the knee  that there was an excessive amount of fibrinous tissue.  It did not appear  to be a knee that had an aseptic loosening component, particularly after  only a short 2 year period of time.  Following removal of scar tissue and  tissue within the medial and lateral gutters and suprapatellar pouch, knee  exposure  was obtained.  The femoral component was debrided using osteotomes.  There was noted to be excessive amount of fibrinous, granulation tissue  underneath the femoral component.  The femoral component came off without a  significant amount of effort.  Upon removal of the femoral component, there  was noted as to be somewhat expected from the lateral femoral radiograph,  large pockets of eroded bone in both the medial and lateral femoral  condyles, more so in the lateral femoral condyle.  This was filled with this  fibrinous-type tissue.  All this tissue was sent to pathology.  Upon removal  of tibial component which was easily removed and elevated off the cement  mantle, there was noted to be some subsidence of the tibial component within  the tibia and large pockets of eroded bone as well filled with this  fibrinous tissue.  All tissue was sent to pathology for frozen section.  Discussion with pathology once this was available initially  revealed a  significant amount of fibrinous tissue.  The pathologist was concerned about  infection but stated that the initial evaluation revealed less than 5 white  cells per high powered field.   He wished to study this further within a 10 minute period of time of [place]  debriding the knee, called back and said that he was even more concerned  about the idea of an infection based on neutrophil penetration of soft  tissue in synovial lining.  This would be consistent with the findings  intraoperative.  Based on this, the clinical decision was made that this  knee had a significant concern for infection.  Based on the erosive changes  of bone, I felt that the best decision was to place an antibiotic spacer  following debridement and treat with IV antibiotics for a period of time  prior to placement of a total knee replacement.  At this point, extensive  debridement was carried out followed by irrigating the knee with 6 L of  normal saline solution.  All cement was debrided including from the patella  button.  The canals were hand-reamed to 15 mm for debridement.  Canals were  irrigated as well.  At this point, two bags of cement were mixed with 2 g of  vancomycin and 1 dose of Tobramycin.  A cement spacer was made to fit  between the tibial femoral articulation and a shield into the patellofemoral  compartment.  At this point, a medium Hemovac drain was placed deep, and #1  PDS was used to reapproximate the extensor mechanism followed by 0 PDS for  the subcuticular layer and staples on the skin.  The patient's knee was then  dressed sterilely with Adaptic dressing sponges and a bulky sterile  dressing.  He was transferred to the recovery room, extubated, and in a  stable condition.      MDO/MEDQ  D:  08/08/2004  T:  08/08/2004  Job:  [id number]

## 2010-09-05 NOTE — H&P (Signed)
NAME:  [name], [name]                        ACCOUNT NO.:  [id number]   MEDICAL RECORD NO.:  [id number]                   PATIENT TYPE:   LOCATION:                                       FACILITY:   PHYSICIAN:  Erasmo Leventhal, M.D.         DATE OF BIRTH:  [birth year]   DATE OF ADMISSION:  02/20/2003  DATE OF DISCHARGE:                                HISTORY & PHYSICAL   CHIEF COMPLAINT:  Left knee osteoarthritis.   HISTORY OF PRESENT ILLNESS:  This is a 77 year old gentleman with a long  history of osteoarthritis of bilateral knees, left greater than right.  The  patient has had failed conservative treatment of his left knee, and after  discussion of treatment options, risks and benefits, is now scheduled for  total knee arthroplasty of the left knee.  Again, surgery risks and benefits  and aftercare were discussed with the patient, questions were invited and  answered, surgery will go ahead as scheduled.   ALLERGIES:  CODEINE which gives him a skin rash.   CURRENT MEDICATIONS:  1. Dilacor XR 240 mg one q.d.  2. Mirapex 1.5 mg one t.i.d.  3. Nexium 40 mg one p.o. q.d.  4. Celebrex 200 mg one p.o. q.d.  5. Zocor 20 mg Monday, Wednesday, and Friday and Zocor 40 mg Tuesday,     Thursday, and Saturday.  6. Amantadine 100 mg one b.i.d.   ILLNESSES:  1. Coronary artery disease.  2. Coronary spastic artery disease.  3. Hypercholesterolemia.  4. GERD.  5. Parkinson's disease.   PAST SURGICAL HISTORY:  1. Heart catheterization.  2. Cataracts.  3. Knee arthroscopy.  4. Right total knee replacement.   SOCIAL HISTORY:  The patient is married.  He is retired.  He lives at home.  He drinks moderately and does not smoke.   FAMILY HISTORY:  Positive for cancer, CVA, and arthritis.   REVIEW OF SYSTEMS:  CENTRAL NERVOUS SYSTEM:  Negative for headache, blurry  vision, or dizziness.  Positive for Parkinson's disease.  PULMONARY:  Negative for shortness of breath, PND, or  orthopnea.  CARDIOVASCULAR:  Positive for a history of coronary spastic disease, negative for angina.  GASTROINTESTINAL:  Positive for GERD.  GENITOURINARY:  Negative for urinary  tract difficulties.  MUSCULOSKELETAL:  Positive in HPI.   PHYSICAL EXAMINATION:  GENERAL:  A well-developed, well-nourished gentleman  in no acute distress.  VITAL SIGNS:  BP 110/60, respirations 12, pulse 68 and regular.  HEENT:  Head normocephalic, nose patent, ears patent.  Pupils equal, round,  reactive to light.  Throat without injection.  NECK:  Supple without adenopathy.  Carotids 2+ without bruit.  CHEST:  Clear to auscultation, no rales or rhonchi.  Respirations 12.  HEART:  Regular rate and rhythm at 68 beats per minute without murmur.  ABDOMEN:  Soft with active bowel sounds, no masses or organomegaly.  NEUROLOGIC:  The patient is alert and oriented to time,  place, and person.  Cranial nerves II-XII grossly intact.  EXTREMITIES:  Within normal limits with the exception of the right knee  status post total knee arthroplasty with 0 to 115 degree range of motion and  good stability.  The left knee shows a 30 degree flexure contracture with  further flexion to 125 degrees with positive crepitation throughout motion,  slight varus deformity, dorsalis pedis and posterior tibialis pulses are 2+,  and sensation is normal.   LABORATORY DATA:  X-rays show end-stage osteoarthritis, left knee.   IMPRESSION:  End-stage osteoarthritis, left knee.   PLAN:  Total knee arthroplasty, left knee.   Please note that the patient's medical physicians will be notified during  his admission for medical followup.       Jaquelyn Bitter. Chabon, P.A.                   Erasmo Leventhal, M.D.    SJC/MEDQ  D:  02/14/2003  T:  02/14/2003  Job:  [id number]

## 2010-09-05 NOTE — Op Note (Signed)
NAME:  [name], [name] NO.:  [id number]   MEDICAL RECORD NO.:  [id number]                   PATIENT TYPE:  INP   LOCATION:  0001                                 FACILITY:  [facility]   PHYSICIAN:  Erasmo Leventhal, M.D.         DATE OF BIRTH:  12/14/1926   DATE OF PROCEDURE:  02/20/2003  DATE OF DISCHARGE:                                 OPERATIVE REPORT   PREOPERATIVE DIAGNOSIS:  Left knee end-stage osteoarthritis.   POSTOPERATIVE DIAGNOSIS:  Left knee end-stage osteoarthritis.   PROCEDURE:  Left total knee arthroplasty.   SURGEON:  Erasmo Leventhal, M.D.   ASSISTANT:  Jaquelyn Bitter. Chabon, P.A.   ANESTHESIA:  Combination of spinal and epidural.   ESTIMATED BLOOD LOSS:  Less than 100 mL.   DRAINS:  Two medium Hemovac.   COMPLICATIONS:  None.   TOURNIQUET TIME:  1 hour and 20 minutes at 375 mmHg.   DISPOSITION:  To PACU stable.   IMPLANTS:  Osteonics components. All cemented. Posterior stabilized. A size  9 femur, size 9 tibia, 12 mm flex insert with a 26 mm patella.   DESCRIPTION OF PROCEDURE:  The patient was counselled in the holding area,  correct side was identified, IV was started, antibiotics were given, chart  was signed appropriately, taken to the operating room, placed with the  spinal anesthesia was administered. Preoperative antibiotics were given, 1 g  of Ancef intravenously. Turned in supine position, Foley catheter placed  utilizing sterile technique by the OR circulating nurse. The left knee was  examined, 5 degree flexion contracture, flexed to 125 degrees, prepped with  duraprep and all were draped in a sterile fashion, exsanguinated with an  esmarch, tourniquet was inflated to 375 mmHg.   A straight midline incision was made through the skin and subcutaneous  tissue, meticulous hemostasis was performed, medial and lateral soft tissue  flaps were developed. Medial parapatellar arthrotomy was performed. Proximal  medial soft tissue release was done to balance the soft tissues. The patella  was everted, knee was flexed, end-stage arthritic changes bone against bone.  Cruciate ligaments were resected. __________ synovitis and synovectomy was  performed. A starting hole was made in the distal femur, canal was irrigated  until the effluent clear. The intramedullary rod was gently placed. I chose  a 5 degree valgus cut based upon the distal femur with a 2 mm cut off the  distal femur. The distal femur was found to be a size #9, rotation marks  were made and it was cut to fit a size #9. The medial and lateral menisci  removed under direct visualization. Inferomedial and lateral geniculates  were coagulated as was the middle geniculate vessel. The posterior  neurovascular structures were thought of and protected throughout the entire  case. The tibia was found to be a size #9, the tibial eminence was resected,  starting hole was made, canal was meticulously irrigated until the effluent  was clear. A step drill was utilized and a __________ rod was the placed.  The intramedullary rod was then placed, took a 10 mm cut based upon the  lateral side with a 0 degree slope. Posteromedial and posterolateral femoral  condyles were palpated and no osteophytes posteriorly. The femoral trochlea  was prepared in the standard fashion. At this time, a size 9 femur, a size 9  tibia, 10 mm flex insert. We had excellent range of motion and soft tissue  balance, alignment was excellent, rotation marks made in the tail of the  tibia and the delta keel was performed in the standard fashion. The patella  was found to be a size 26 and was reamed to a depth of 10 mm, locking holes  were made and excess bone was removed. The knee was then irrigated with  pulsatile lavage copiously as the cement was properly mixed on the back  table utilizing modern cement technique. All components were cemented into  place. A size 9 tibia, size 9  femur, 26 patella. After the cement had cured,  I went through trials of 10 and 12 mm flexed insert with a 12 mm flex  insert. We had excellent flexion extension balance, patellofemoral tracking  was anatomic and did not require a lateral released. He was also stable to  varus and valgus stressing. The knee was then copiously irrigated. Bone wax  was placed. We then tapped into place a 12 mm flex insert. Again excellent  range of motion and soft tissue. Bone wax was placed for exposed bony  surfaces and revealed that hemostasis was performed. Two mini Hemovac drains  were placed. The knee joint was irrigated with antibiotic solution during  the closure. A sequential closure of layers were done with Vicryl,  arthrotomy Vicryls, subcu Vicryl, skin closed with subcuticular Monocryl  suture. Steri-Strips were gently applied. Xeroform around the drain. A  sterile compressive wrap was applied to the knee, tourniquet was deflated  and excellent pulse in the foot and ankle at the end of the case. Given  another g of Ancef intravenously, tourniquet was deflated. Sponge and needle  count were correct. There were no complications. He was then gently from the  operating room to PACU in stable condition. No complications. Sponge and  needle count were correct.                                               Erasmo Leventhal, M.D.    RAC/MEDQ  D:  02/20/2003  T:  02/20/2003  Job:  [id number]

## 2010-09-05 NOTE — Op Note (Signed)
[name], [name] NO.:  [id number]   MEDICAL RECORD NO.:  [id number]          PATIENT TYPE:  INP   LOCATION:  0009                         FACILITY:  [facility]   PHYSICIAN:  Madlyn Frankel. Charlann Boxer, M.D.  DATE OF BIRTH:  13-May-1926   DATE OF PROCEDURE:  09/25/2004  DATE OF DISCHARGE:                                 OPERATIVE REPORT   PREOPERATIVE DIAGNOSIS:  Status post resection of right total knee  replacement.   POSTOPERATIVE DIAGNOSIS:  Status post resection of right total knee  replacement.   PROCEDURE:  Reimplantation of right total knee replacement.   COMPONENTS USED:  DePuy Revision knee system with a size 4 femur with a 4 mm  distal augment and 4 mm posterior augments both medially and laterally and a  90 mm cemented stem x 15 mm.  Tibial component with a size 3 tibial tray,  mobile-bearing revision tray, MBT revision tray, with a 60 mm x 13 mm  cemented stem.  No augments.  I used a 17.5 mm x size 4 rotating platform  polyethylene liner.   SURGEON:  Madlyn Frankel. Charlann Boxer, M.D.   ASSISTANT:  Erasmo Leventhal, M.D.   ANESTHESIA:  General.   BLOOD LOSS:  150 cc.   TOURNIQUET TIME:  Two hours at 300 mmHg.   DRAINS:  One.   COMPLICATIONS:  None apparent.   INDICATIONS FOR PROCEDURE:  [name] is a very pleasant 77-year-  old gentleman who presented at the kind referral of Dr. Hayden Rasmussen for  evaluation of a painful and apparently loose right tibial tray.  He was  status post bilateral total knee replacements.  Workup for the right knee  appeared to be noninfectious with labs and aspiration; however, upon taken  to the OR for revision of his tibial component, his bone quality appeared to  be sufficiently soft and abnormal, giving me the concern that there was  infection.  For this reason, resection arthroplasty was carried out.  Cultures were all negative.  He was treated with six weeks of IV antibiotics  and is now ready for  reimplantation.  Risks and benefits had been reviewed  with him.  Consent was obtained.   PROCEDURE IN DETAIL:  Patient was brought to the operating theater.  Once  adequate anesthesia and preoperative antibiotics and 1 gm of Ancef were  administered, the patient was positioned supine.  The proximal thigh  tourniquet was placed.  The right lower extremity was then prepped and  draped in a sterile fashion.  Midline incision was made.  Extension  mechanisms were identified and debrided out.  The medial parapatellar  arthrotomy was then carried out.  Based on the amount of scar tissue that  had formed in this knee, despite efforts with cement spacers, I still made a  quad snip.  This was done approximately 4 cm proximal pole of the patella.  Once this was carried out, exposure of the knee was able to be obtained.  Exposure of the knee was carried out with debridement of soft tissues and  exposure of the bony landmarks.  Following this, attention was directed to  the instrumentation of the distal tibia and the femur.  Both reamings were  carried out and were reamed up to the 17 to get good cortical contact of the  femur and degenerative 15 distal inter-tibia.  I chose to use cemented stems  on both sides.   The tibial tray was then prepared per protocol.  Utilizing an intramedullary  device, I made a freshen-up cut of about 2-3 mm off the medial side, which  provided adequate resection.  I sized the tibia to be a size 3.  Overall  observation and size appeared to be a size 4 femur, but I would have the  option if it was too tight and flexed together with a size 3.  Once the  tibial cut was made, attention was directed to the femur.  Very little bone  was able to be resected off the anterior aspect of the tibia or off of the  femur.  I did do a distal clean-up cut.   Following the distal femoral cut, the +2 bolt was positioned anteriorly to  allow the shifting of the component posteriorly.   Still, there was very  little cut off the anterior cortex.  Anterior, posterior, and chamfer cuts  were then made, using the size 4 block.  Following this was preparation of  the notch.  I did decide that an effort to improve my flexion gap, I would  put a 4 mm augment on the medial aspect of the distal femur.  This provided  good fixation and stability with trial components in place.  Note that the  posterior cut off the femur did require a 4 mm cut, and two augments were  placed there as well.  A trial reduction with a size 4 femur with a stem and  4 mm augments posteriorly and a medial distal 4 augment was placed.  This  sat nicely on the bone cuts.  The tibial tray was then positioned.  I did  initially use a 15 mm poly and felt that it came out with hyperextension, so  we put a 17.5 in.  The knee came out to full extension.  It was stable  throughout with flexion and extension.   With the trial components then in position, the patella was then cut.  When  I cut it down to a 15 mm, I then placed a 38 patellar button in place.  These holes were drilled.  Trial reduction not possible.  It failed due to  the quad snip.  At this point, final components were brought to the field.   Once final components were brought to the field, femur and tibial components  were constructed.  The knee was copiously irrigated with normal saline  solution.  I did cement the tibial and femoral components in separately with  the tibial component first.  Two bags of cement with 1 gm of vancomycin were  mixed per bag.  Once the tibial component was prepared and the cement mixed,  it was cemented into position with a trial femur placed in its position  using a 17 mm spacer to hold pressure. Following this, the femur was  cemented into position.  Note that cement restrictors were placed prior to  preparation of the canal.  There was noted to be some deficiency of cement over the distal lateral side of the femur,  but I chose not to fill this void  in with any extra cement,  due to the fact of the significant inadequate  cementing everywhere else.  Once the cement had cured, excessive cement was  removed, and this included around the patella, tibial and femoral component.  Final 17.5 size 4 polyethylene posterior stabilized liner was then placed  into the knee after irrigation.  The knee was reduced.  At this point, the  wound was copiously irrigated with normal saline solution.  The tourniquet  was let down at two hours.  Hemostasis was obtained as necessary, but there  was significant synovial bleeding.  The quad snip was prepared using a #1  PDS followed by repair of the extensor mechanism in a uniform fashion.  The  wound was closed in layers with staples on the skin.  The knee was cleaned,  dried, and dressed sterilely.  Note that a medium Hemovac drain was placed.  A bulky Jones dressing was applied.  The patient was then extubated and  transferred to the recovery room in stable condition.       MDO/MEDQ  D:  09/25/2004  T:  09/25/2004  Job:  [id number]

## 2010-09-05 NOTE — Procedures (Signed)
[facility]  Patient:    [name], [name]                    MRN: [id number] Proc. Date: 05/25/00 Adm. Date:  [id number] Attending:  Sabino Gasser                           Procedure Report  PROCEDURE:  Colonoscopy.  INDICATIONS:  Colon polyps and right-sided abdominal pain.  ANESTHESIA:  Fentanyl 75 mcg, Versed 7 mg.  DESCRIPTION OF PROCEDURE:  With the patient mildly sedated in the left lateral decubitus position, the Olympus videoscopic colonoscope was inserted into the rectum and passed through a tortuous sigmoid colon to the cecum.  The cecum identified by the ileocecal valve and appendiceal orifice, both of which were photographed.  There was a small polyp in the cecum, photographed and removed using hot biopsy forceps technique on a setting of 20/20 blended current.  We then entered into the terminal ileum via the ileocecal valve and traversed a number of centimeters.  All of this appeared normal, was photographed, and biopsies were taken as well because of the right-sided abdominal pain. Subsequently then, the Olympus videoscopic colonoscope was then slowly withdrawn, taking circumferential views of the remaining terminal ileum and colonic mucosa, stopping to photograph diverticula seen along the way in the sigmoid colon and hemorrhoids that were seen on retroflex view of the anal canal.  The endoscope was then straightened and withdrawn.  The patients vital signs and pulse oximeter remained stable.  The patient tolerated the procedure well without apparent complications.  FINDINGS:  Polyp of cecum.  Diverticulosis of sigmoid colon.  Internal hemorrhoids, and otherwise unremarkable colonoscopic examination, including the cecum. DD:  05/25/00 TD:  05/26/00 Job: [id number]

## 2010-09-05 NOTE — Discharge Summary (Signed)
NAME:  [name], [name] NO.:  [id number]   MEDICAL RECORD NO.:  [id number]                   PATIENT TYPE:  INP   LOCATION:  0464                                 FACILITY:  [facility]   PHYSICIAN:  Erasmo Leventhal, M.D.         DATE OF BIRTH:  April 22, 1926   DATE OF ADMISSION:  02/20/2003  DATE OF DISCHARGE:  02/25/2003                                 DISCHARGE SUMMARY   ADMISSION DIAGNOSIS:  End-stage osteoarthritis, left knee.   DISCHARGE DIAGNOSIS:  End-stage osteoarthritis, left knee.   OPERATION:  Total knee arthroplasty, left knee.   BRIEF HISTORY:  This is a 77 year old gentleman with a long history of  osteoarthritis, bilateral knees, left greater than right. He has failed  conservative management to his left knee and after discussion of treatment  options, risks, and benefits, he was scheduled for total knee arthroplasty  of the left knee. Surgery, risk factors, and aftercare were discussed with  the patient, questions were provided and answered, and surgery is to go  ahead as scheduled.   LABORATORY VALUES:  Admission CBC within normal limits. Hemoglobin and  hematocrit reached a low of 11.3 and 32.3 on the third and by the fifth went  back up to 11.4 and 32.8. PT-INR by discharge was 19.8 with an INR of 2.1.  Admission BMET within normal limits with the exception of an elevated  glucose of 118.  He was hyponatremic throughout the admission and last BMET  had a sodium of 141, glucose 129, and calcium 8.1.  TSH was 3.094 with a T4  of 4.9 which was mildly low.  Urinalysis was within normal limits.   COURSE IN THE HOSPITAL:  The patient tolerated the operative procedure well.  Medical consult was obtained due to the patient's history of Parkinson  disease, reflux, hyperlipidemia, diverticulitis, and coronary artery  disease.  On the first postoperative day, the patient was feeling good,  vital signs were stable, and he was afebrile. Oxygen  saturation was 95% to  96%. I&Os were good. He was hyponatremic. He was started on bed-to-chair  transfers with the epidural out the following a.m. and medical service was  to address his hyponatremia. On the second postoperative day he continued to  feel good, his vital signs were stable, he was afebrile. He was alert and  oriented. His hemoglobin and hematocrit were stable. He continued to be  hyponatremic.  His dressing was changed. His wound was benign.  Calves were  negative. Lung sounds were clear. Bowel sounds were normal. He was started  on physical therapy after his catheter was removed. The patient did have  some difficulty with increased Parkinson symptoms and this was attributed to  Reglan, and this was discontinued. In addition, his D5 and half-normal  saline was discontinued and this may have been contributing to his  hyponatremia. Also, TSH was checked on the patient to see this was  contributing as well. On  the third postoperative day the patient was feeling  good. He had minimal pain. Vital signs were stable and he was afebrile. I&Os  were good. Hemoglobin and hematocrit were stable. His dressing was changed.  His wound was benign. Heart sounds were normal. He had no tenderness in the  calf and discharge planning was made for Sunday. The following day he was  doing well, mostly complaining of constipation and medication was prescribed  for that. On February 25, 2003, he was in improved. Vital signs were stable.  The patient was afebrile. PT was 19.8 with INR of 2.1. The patient was  subsequently discharged to follow up in the office.   CONDITION ON DISCHARGE:  Improved.   DISCHARGE MEDICATIONS:  1. Percocet one pill q.6h. p.r.n. pain.  2. Robaxin 500 mg p.o. q.8h. p.r.n. spasm.  3. Trinsicon one pill b.i.d. for anemia.  4. Coumadin as directed by pharmacy.   He is to do his home physical therapy, do his CPM, and call the office if  any problems or questions  arise.     Jaquelyn Bitter. Chabon, P.A.                   Erasmo Leventhal, M.D.    SJC/MEDQ  D:  03/31/2003  T:  03/31/2003  Job:  [id number]

## 2010-09-05 NOTE — Consult Note (Signed)
NAMETYQUEZ, HOLLIBAUGH NO.:  [id number]   MEDICAL RECORD NO.:  [id number]          PATIENT TYPE:  INP   LOCATION:  3025                         FACILITY:  [facility]   PHYSICIAN:  Genene Churn. Love, M.D.    DATE OF BIRTH:  08-29-1926   DATE OF CONSULTATION:  10/19/2004  DATE OF DISCHARGE:                                   CONSULTATION   REASON FOR CONSULTATION:  This 77 year old right-handed white married male  is seen at the request of [facility] for evaluation of confusion  and tremor.   HISTORY OF PRESENT ILLNESS:  Mr. Harps has a long history of  hypertension, gastroesophageal reflux disease, hyperlipidemia, coronary  artery disease, and status post transluminal angioplasty and orthopedic  problems. He has had bilateral knee replacement on the right and the left.  He had a right knee replacement 2004 and did very well with this until April  of 2006 when he developed an infection requiring six weeks of PICC line  vancomycin therapy. This was followed by total knee replacement September 25, 2004  by Dr. Charlann Boxer. At home, he has been doing well, walking with a cane but since  Friday, October 17, 2004, has developed new problems with increasing gait  disorder, increasing tremors, falls x2 without head trauma, and confusion.  This was preceded by a bizarre dreams at night that were repetitive and  stereotypic. He has been seeing people in his room. There is no history of  alcoholism or drug withdrawal. He has been on amantadine 100 milligrams  t.i.d., Coumadin dosage unknown, Nexium, Dilacor. He finished a course of  Keflex recently, ferrous sulfate at night, coenzyme Q10, Hydrocodone, and  Robaxin. His medicines are put out in a daily distribution compartment  container but his wife usually gives him his medications.   Friday p.m., he started having hallucinations. He fell this morning about  1:30 or 6:30 a.m. on the floor. He was brought to the emergency room and a  CT scan of brain has been unremarkable. At times, he makes perfectly good  since but at other times he will have hallucinations. There has been no  recent fever or chills. He has had no bowel or bladder incontinence. His CT  scan of the brain without contrast enhancement showed definite abnormalities  and his metabolic parameters were normal. Exactly how much medication he has  been taking is not known.   PHYSICAL EXAMINATION:  GENERAL:  His examination revealed a well-developed  white male lying in bed who intermittently with head tremors. He had some  resting tremors right hand and arm. Right leg was externally rotated at the  hip and flexed at the knee. He had heat to his right knee.  VITAL SIGNS:  Blood pressure right arm was 140/80 left arm 140/80, standing  right arm was 130/80, heart rate was 74 and regular.  NECK:  There were no bruits. Neck flexion/extension maneuvers were  unremarkable status.  NEUROLOGICAL:  He was alert, oriented x3. Scored 26 out of 30 on MMSE, but  could be easily confused. There was no aphasias. Cranial nerve examination  visual fields to be both full. Disks flat, spontaneous venous pulsations  seen. Extraocular movements were full. Corneals were present. There was no  seventh nerve palsy. Tongue was midline. Uvula was midline. Gags were  present. He had tremor in his voice. His motor examination revealed good  strength in the upper and lower extremities. He had outstretched hand and  arm tremor, some cogwheeling at the right elbow, positive Myerson's sign. He  could walk with the walker but then would develop severe shaking episodes  that would occur intermittently. He had decreased knee jerks but other  reflexes were intact. Plantar responses were downgoing.   IMPRESSION:  1.  Delirium 298.9.  2.  Parkinson's disease 332.0.   PLAN:  Plan at this time is to have his wife review the medications that he  has been taking at home. I am suspicious that  there is a medication error  which is causing his current symptomatology. We will decrease his amantidine  to 50 milligrams t.i.d.       JML/MEDQ  D:  10/19/2004  T:  10/19/2004  Job:  [id number]

## 2010-09-05 NOTE — H&P (Signed)
[name], PARENTEAU NO.:  [id number]   MEDICAL RECORD NO.:  [id number]          PATIENT TYPE:  IPS   LOCATION:  4039                         FACILITY:  [facility]   PHYSICIAN:  Erick Colace, M.D.DATE OF BIRTH:  12-06-1926   DATE OF ADMISSION:  08/12/2004  DATE OF DISCHARGE:                                HISTORY & PHYSICAL   This is a rehabilitation admission.   CHIEF COMPLAINT:  Right knee pain, status post resection/TKR.   A 77 year old male with a history of right TKR, February 24, 2002, admitted  to [facility], on August 08, 2004, with right knee pain  unresponsive to conservative care.  Aspiration of the knee was negative.  Erythrocyte sedimentation rate was 40.  He was diagnosed with aseptic  loosening of right knee prosthesis, underwent right knee resection with  revision arthroplasty, on August 08, 2004, per Dr. Durene Romans.  He was  placed on Coumadin for DVT prophylaxis and was made touch-down  weightbearing.  Infectious disease followup recommended IV vancomycin x 6  weeks.  PICC line placed, August 11, 2004.   REVIEW OF SYSTEMS:  Significant for reflux, joint swelling, low back pain,  as well as right upper extremity tremor.  He denies chest pain and nausea or  vomiting.   PAST MEDICAL HISTORY:  1.  Hypertension.  2.  GERD.  3.  Diverticulitis.  4.  Hyperlipidemia.  5.  Parkinson disease.  6.  Coronary artery disease, status post PTCA.   PAST SURGICAL HISTORY:  1.  Cataract.  2.  Right TKR, February 24, 2002.  3.  Left TKR, November 2004.   FAMILY HISTORY:  Noncontributory.   SOCIAL HISTORY:  He lives with his wife in a two-level home with a bedroom  on the first level.  His wife can assist.   FUNCTIONAL HISTORY:  Independent, using a cane at times.   CURRENT FUNCTIONAL STATUS:  Moderate assistance with bed mobility, moderate  assistance with transfers, minimal assistance with gait 20 feet with a  rolling walker.   ALLERGIES:  CODEINE.   CURRENT MEDICATIONS:  1.  Lovenox subcu 30 mg q.12h. until INR greater than 2.  2.  Vancomycin IV per pharmacy protocol.  3.  Amantidine 100 mg p.o. t.i.d.  4.  Cardizem CD 120 mg p.o. every day.  5.  Colace 100 mg p.o. b.i.d.  6.  Protonix 40 mg p.o. every day.  7.  Trinsicon one p.o. b.i.d.  8.  Coumadin as per pharmacy protocol.   His last hemoglobin was 10, last BUN 10, last creatinine 0.9, last INR, on  August 12, 2004, at 1.6.   PHYSICAL EXAMINATION:  VITAL SIGNS:  Blood pressure 120/50, pulse 70,  respirations 16, temp 97%.  GENERAL APPEARANCE:  Normal.  Elderly male, has a right resting hand tremor.  EYES:  Anicteric.  Not injected.  ENT:  Normal.  NECK:  Supple without adenopathy.  LUNGS:  Clear.  Normal respiratory effort.  HEART:  Regular rate and rhythm.  No extra sounds.  EXTREMITIES:  He has trace edema bilateral pedal.  No clubbing, cyanosis,  or  edema.  His motor strength is 4/5 bilateral upper extremities as well as  bilateral lower extremities with the exception of the right hip flexor.  Quad not tested secondary to the right knee brace.  Sensation is normal.  ABDOMEN:  Positive bowel sounds.  Soft nontender to palpation.  NEUROLOGIC:  He is alert and oriented x 3.  Normal memory, mood, and affect.   IMPRESSION:  1.  Status post right total knee replacement revision secondary to suspected      infection, August 08, 2004.  He is on IV vancomycin total six weeks. Pain      management:  Vicodin.  We will add Robaxin for some muscle spasms.  2.  Deep vein thrombosis prophylaxis.  INR is sub-therapeutic at 1.6.  We      will add subcutaneous Lovenox 30 mg subcutaneously twice a day until INR      is greater than 2.0.  3.  Anemia.  Followup CBC.  4.  Parkinson disease.  Continue Amantidine.  5.  Hypertension.  6.  Gastroesophageal reflux disease.  7.  Hyperlipidemia.  8.  Coronary artery disease, percutaneous transluminal coronary  angioplasty.  9.  Left total knee replacement, November 2004.   ESTIMATED LENGTH OF STAY:  Seven days.   The patient is a good rehab candidate.  He will need vancomycin from home  health post discharge to round out his therapy.   His prognosis for functional improvement is good.      AEK/MEDQ  D:  08/12/2004  T:  08/12/2004  Job:  [id number]   cc:   Genene Churn. Love, M.D.  [address]  [address]  [place]  [place] [address]  Fax: [phone]   Madlyn Frankel. Charlann Boxer, M.D.  [facility]  [address]  [address]  [place]  [place] [address]  Fax: [phone]

## 2012-07-11 ENCOUNTER — Ambulatory Visit (INDEPENDENT_AMBULATORY_CARE_PROVIDER_SITE_OTHER): Payer: Self-pay | Admitting: Surgery

## 2012-07-26 ENCOUNTER — Ambulatory Visit (INDEPENDENT_AMBULATORY_CARE_PROVIDER_SITE_OTHER): Payer: Medicare Other | Admitting: Surgery

## 2012-07-26 ENCOUNTER — Encounter (INDEPENDENT_AMBULATORY_CARE_PROVIDER_SITE_OTHER): Payer: Self-pay | Admitting: Surgery

## 2012-07-26 VITALS — BP 128/68 | HR 72 | Resp 18 | Ht 66.0 in | Wt 156.0 lb

## 2012-07-26 DIAGNOSIS — K4020 Bilateral inguinal hernia, without obstruction or gangrene, not specified as recurrent: Secondary | ICD-10-CM

## 2012-07-26 DIAGNOSIS — N401 Enlarged prostate with lower urinary tract symptoms: Secondary | ICD-10-CM

## 2012-07-26 HISTORY — DX: Bilateral inguinal hernia, without obstruction or gangrene, not specified as recurrent: K40.20

## 2012-07-26 MED ORDER — TAMSULOSIN HCL 0.4 MG PO CAPS: 0.4000 mg | ORAL_CAPSULE | Freq: Every day | ORAL | Status: DC

## 2012-07-26 NOTE — Patient Instructions (Signed)
Hernia A hernia occurs when an internal organ pushes out through a weak spot in the abdominal wall. Hernias most commonly occur in the groin and around the navel. Hernias often can be pushed back into place (reduced). Most hernias tend to get worse over time. Some abdominal hernias can get stuck in the opening (irreducible or incarcerated hernia) and cannot be reduced. An irreducible abdominal hernia which is tightly squeezed into the opening is at risk for impaired blood supply (strangulated hernia). A strangulated hernia is a medical emergency. Because of the risk for an irreducible or strangulated hernia, surgery may be recommended to repair a hernia. CAUSES   Heavy lifting.  Prolonged coughing.  Straining to have a bowel movement.  A cut (incision) made during an abdominal surgery. HOME CARE INSTRUCTIONS   Bed rest is not required. You may continue your normal activities.  Avoid lifting more than 10 pounds (4.5 kg) or straining.  Cough gently. If you are a smoker it is best to stop. Even the best hernia repair can break down with the continual strain of coughing. Even if you do not have your hernia repaired, a cough will continue to aggravate the problem.  Do not wear anything tight over your hernia. Do not try to keep it in with an outside bandage or truss. These can damage abdominal contents if they are trapped within the hernia sac.  Eat a normal diet.  Avoid constipation. Straining over long periods of time will increase hernia size and encourage breakdown of repairs. If you cannot do this with diet alone, stool softeners may be used. SEEK IMMEDIATE MEDICAL CARE IF:   You have a fever.  You develop increasing abdominal pain.  You feel nauseous or vomit.  Your hernia is stuck outside the abdomen, looks discolored, feels hard, or is tender.  You have any changes in your bowel habits or in the hernia that are unusual for you.  You have increased pain or swelling around the  hernia.  You cannot push the hernia back in place by applying gentle pressure while lying down. MAKE SURE YOU:   Understand these instructions.  Will watch your condition.  Will get help right away if you are not doing well or get worse. Document Released: 04/06/2005 Document Revised: 06/29/2011 Document Reviewed: 11/24/2007 [facility] Patient Information 2013 [place], [place].  Managing Pain  Pain after surgery or related to activity is often due to strain/injury to muscle, tendon, nerves and/or incisions.  This pain is usually short-term and will improve in a few months.   Many people find it helpful to do the following things TOGETHER to help speed the process of healing and to get back to regular activity more quickly:  1. Avoid heavy physical activity a.  no lifting greater than 20 pounds b. Do not "push through" the pain.  Listen to your body and avoid positions and maneuvers than reproduce the pain c. Walking is okay as tolerated, but go slowly and stop when getting sore.  d. Remember: If it hurts to do it, then don't do it! 2. Take Anti-inflammatory medication  a. Take with food/snack around the clock for 1-2 weeks i. This helps the muscle and nerve tissues become less irritable and calm down faster b. Choose ONE of the following over-the-counter medications: i. Naproxen 220mg  tabs (ex. Aleve) 1-2 pills twice a day  ii. Ibuprofen 200mg  tabs (ex. Advil, Motrin) 3-4 pills with every meal and just before bedtime iii. Acetaminophen 500mg  tabs (Tylenol) 1-2 pills with  every meal and just before bedtime 3. Use a Heating pad or Ice/Cold Pack a. 4-6 times a day b. May use warm bath/hottub  or showers 4. Try Gentle Massage and/or Stretching  a. at the area of pain many times a day b. stop if you feel pain - do not overdo it  Try these steps together to help you body heal faster and avoid making things get worse.  Doing just one of these things may not be enough.    If you are not  getting better after two weeks or are noticing you are getting worse, contact our office for further advice; we may need to re-evaluate you & see what other things we can do to help.  HERNIA REPAIR: POST OP INSTRUCTIONS  1. DIET: Follow a light bland diet the first 24 hours after arrival home, such as soup, liquids, crackers, etc.  Be sure to include lots of fluids daily.  Avoid fast food or heavy meals as your are more likely to get nauseated.  Eat a low fat the next few days after surgery. 2. Take your usually prescribed home medications unless otherwise directed. 3. PAIN CONTROL: a. Pain is best controlled by a usual combination of three different methods TOGETHER: i. Ice/Heat ii. Over the counter pain medication iii. Prescription pain medication b. Most patients will experience some swelling and bruising around the hernia(s) such as the bellybutton, groins, or old incisions.  Ice packs or heating pads (30-60 minutes up to 6 times a day) will help. Use ice for the first few days to help decrease swelling and bruising, then switch to heat to help relax tight/sore spots and speed recovery.  Some people prefer to use ice alone, heat alone, alternating between ice & heat.  Experiment to what works for you.  Swelling and bruising can take several weeks to resolve.   c. It is helpful to take an over-the-counter pain medication regularly for the first few weeks.  Choose one of the following that works best for you: i. Naproxen (Aleve, etc)  Two 220mg  tabs twice a day ii. Ibuprofen (Advil, etc) Three 200mg  tabs four times a day (every meal & bedtime) iii. Acetaminophen (Tylenol, etc) 325-650mg  four times a day (every meal & bedtime) d. A  prescription for pain medication should be given to you upon discharge.  Take your pain medication as prescribed.  i. If you are having problems/concerns with the prescription medicine (does not control pain, nausea, vomiting, rash, itching, etc), please call us [phone]  [phone] to see if we need to switch you to a different pain medicine that will work better for you and/or control your side effect better. ii. If you need a refill on your pain medication, please contact your pharmacy.  They will contact our office to request authorization. Prescriptions will not be filled after 5 pm or on week-ends. 4. Avoid getting constipated.  Between the surgery and the pain medications, it is common to experience some constipation.  Increasing fluid intake and taking a fiber supplement (such as Metamucil, Citrucel, FiberCon, MiraLax, etc) 1-2 times a day regularly will usually help prevent this problem from occurring.  A mild laxative (prune juice, Milk of Magnesia, MiraLax, etc) should be taken according to package directions if there are no bowel movements after 48 hours.   5. Wash / shower every day.  You may shower over the dressings as they are waterproof.   6. Remove your waterproof bandages 5 days after surgery.  You  may leave the incision open to air.  You may replace a dressing/Band-Aid to cover the incision for comfort if you wish.  Continue to shower over incision(s) after the dressing is off.    7. ACTIVITIES as tolerated:   a. You may resume regular (light) daily activities beginning the next day-such as daily self-care, walking, climbing stairs-gradually increasing activities as tolerated.  If you can walk 30 minutes without difficulty, it is safe to try more intense activity such as jogging, treadmill, bicycling, low-impact aerobics, swimming, etc. b. Save the most intensive and strenuous activity for last such as sit-ups, heavy lifting, contact sports, etc  Refrain from any heavy lifting or straining until you are off narcotics for pain control.   c. DO NOT PUSH THROUGH PAIN.  Let pain be your guide: If it hurts to do something, don't do it.  Pain is your body warning you to avoid that activity for another week until the pain goes down. d. You may drive when you  are no longer taking prescription pain medication, you can comfortably wear a seatbelt, and you can safely maneuver your car and apply brakes. e. Bonita Quin may have sexual intercourse when it is comfortable.  8. FOLLOW UP in our office a. Please call [facility] at [phone] to set up an appointment to see your surgeon in the office for a follow-up appointment approximately 2-3 weeks after your surgery. b. Make sure that you call for this appointment the day you arrive home to insure a convenient appointment time. 9.  IF YOU HAVE DISABILITY OR FAMILY LEAVE FORMS, BRING THEM TO THE OFFICE FOR PROCESSING.  DO NOT GIVE THEM TO YOUR DOCTOR.  WHEN TO CALL us [phone]: 1. Poor pain control 2. Reactions / problems with new medications (rash/itching, nausea, etc)  3. Fever over 101.5 F (38.5 C) 4. Inability to urinate 5. Nausea and/or vomiting 6. Worsening swelling or bruising 7. Continued bleeding from incision. 8. Increased pain, redness, or drainage from the incision   The clinic staff is available to answer your questions during regular business hours (8:30am-5pm).  Please don't hesitate to call and ask to speak to one of our nurses for clinical concerns.   If you have a medical emergency, go to the nearest emergency room or call 911.  A surgeon from [facility] is always on call at the hospitals in [facility], [place] [address], [place], [place]  [address] ?  [address] [address], [place], [place]   [address] MAIN: [phone] ? TOLL FREE: [phone] ? FAX: [phone] [email or web address]

## 2012-07-26 NOTE — Progress Notes (Signed)
Subjective:     Patient ID: [name], male   DOB: [birth year], 79 y.o.   MRN: [id number]  HPI  [name]  [birth year] [id number]  Patient Care Team: Massie Maroon, MD as PCP - General (Internal Medicine)  This patient is a 79 y.o.male who presents today for surgical evaluation at the request of Dr Selena Batten.   Reason for visit: Left groin pain ? [facility]  Pleasant elderly gentleman.  Very active.  Felt left groin pain after replacing Christmas supplies up in the attic in January.  Persistent.  Was concerned.  Saw his primary care physician.  Concern for a left inguinal hernia.  Therefore, surgical consultation requested.  He walks 2 miles a day with his dog without problems.  No history of cardiac issues.  One of his knee replacements became infected and required revision a few years ago.  Tolerated that fine.  No other infections.  Has bowel movements every other day.  Urinates a few times a night but denies any severe prostate issues.  No fevers or chills.  Patient Active Problem List  Diagnosis  . Bilateral inguinal hernia (BIH)  . BPH associated with mild nocturia    Past Medical History  Diagnosis Date  . 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 Heart murmur 2012  . Parkinson's disease 03/20/1996    Past Surgical History  Procedure Laterality Date  . Cataract eye surgery  08/25/1999  . Total knee arthroplasty  02/24/2002    right knee  . Replacement total knee  02/20/2003  . Revision total knee arthroplasty  08/08/2004    right knee infection  . Revision total knee arthroplasty  09/18/2004    right knee completed  . Brow lift and eyelids  03/06/2008    both eyes    History   Social History  . Marital Status: Married    Spouse Name: N/A    Number of Children: N/A  . Years of Education: N/A   Occupational History  . Not on file.   Social History Main Topics  . Smoking status: Former Smoker    Quit date: 07/26/1969  . Smokeless tobacco: Not on  file  . Alcohol Use: Yes  . Drug Use: No  . Sexually Active: Not on file   Other Topics Concern  . Not on file   Social History Narrative  . No narrative on file    Family History  Problem Relation Age of Onset  . Kidney disease Father   . COPD Sister   . Cancer Sister     Current Outpatient Prescriptions  Medication Sig Dispense Refill  . AZILECT 1 MG TABS       . carbidopa-levodopa (SINEMET IR) 25-100 MG per tablet       . carvedilol (COREG) 3.125 MG tablet       . NEXIUM 40 MG capsule       . pravastatin (PRAVACHOL) 40 MG tablet       . amoxicillin (AMOXIL) 500 MG capsule       . tamsulosin (FLOMAX) 0.4 MG CAPS Take 1 capsule (0.4 mg total) by mouth daily. Start 1 week before surgery.  Continue until >1 weeks after surgery (2 weeks total) to decrease chance of urinary retention  14 capsule  1   No current facility-administered medications for this visit.     Allergies  Allergen Reactions  . Codeine Hives    BP 128/68  Pulse 72  Resp 18  Ht 5\' 6"  (1.676 m)  Wt 156 lb (70.761 kg)  BMI 25.19 kg/m2  No results found.   Review of Systems  Constitutional: Negative for fever, chills and diaphoresis.  HENT: Negative for nosebleeds, sore throat, facial swelling, mouth sores, trouble swallowing and ear discharge.   Eyes: Negative for photophobia, discharge and visual disturbance.  Respiratory: Negative for choking, chest tightness, shortness of breath and stridor.   Cardiovascular: Negative for chest pain and palpitations.  Gastrointestinal: Negative for nausea, vomiting, abdominal pain, diarrhea, constipation, blood in stool, abdominal distention, anal bleeding and rectal pain.  Endocrine: Negative for cold intolerance, heat intolerance, polyphagia and polyuria.  Genitourinary: Negative for dysuria, urgency, frequency, hematuria, discharge, enuresis, penile pain and testicular pain.  Musculoskeletal: Negative for myalgias, back pain, arthralgias and gait problem.   Skin: Negative for color change, pallor, rash and wound.  Neurological: Negative for dizziness, speech difficulty, weakness, numbness and headaches.  Hematological: Negative for adenopathy. Does not bruise/bleed easily.  Psychiatric/Behavioral: Negative for hallucinations, confusion and agitation.       Objective:   Physical Exam  Constitutional: He is oriented to person, place, and time. He appears well-developed and well-nourished. No distress.  HENT:  Head: Normocephalic.  Mouth/Throat: Oropharynx is clear and moist. No oropharyngeal exudate.  Eyes: Conjunctivae and EOM are normal. Pupils are equal, round, and reactive to light. No scleral icterus.  Neck: Normal range of motion. Neck supple. No tracheal deviation present.  Cardiovascular: Normal rate, regular rhythm and intact distal pulses.   Pulmonary/Chest: Effort normal and breath sounds normal. No respiratory distress.  Abdominal: Soft. He exhibits no distension. There is no tenderness. A hernia is present. Hernia confirmed positive in the right inguinal area and confirmed positive in the left inguinal area.  Small bilateral hernias with Valsalva.  Probably direct.  Musculoskeletal: Normal range of motion. He exhibits no tenderness.  Lymphadenopathy:    He has no cervical adenopathy.       Right: No inguinal adenopathy present.       Left: No inguinal adenopathy present.  Neurological: He is alert and oriented to person, place, and time. No cranial nerve deficit. He exhibits normal muscle tone. Coordination normal.  Skin: Skin is warm and dry. No rash noted. He is not diaphoretic. No erythema. No pallor.  Psychiatric: He has a normal mood and affect. His behavior is normal. Judgment and thought content normal.       Assessment:     BIH, left syptomatic     Plan:     I think he would benefit from repair.  He is of advanced age, but he is quite functional and independent.  He wishes to be aggressive as well.  The anatomy  & physiology of the abdominal wall and pelvic floor was discussed.  The pathophysiology of hernias in the inguinal and pelvic region was discussed.  Natural history risks such as progressive enlargement, pain, incarceration & strangulation was discussed.   Contributors to complications such as smoking, obesity, diabetes, prior surgery, etc were discussed.    I feel the risks of no intervention will lead to serious problems that outweigh the operative risks; therefore, I recommended surgery to reduce and repair the hernia.  I explained laparoscopic techniques with possible need for an open approach.  I noted usual use of mesh to patch and/or buttress hernia repair  Risks such as bleeding, infection, abscess, need for further treatment, heart attack, death, and other risks were discussed.  I noted a  good likelihood this will help address the problem.   Goals of post-operative recovery were discussed as well.  Possibility that this will not correct all symptoms was explained.  I stressed the importance of low-impact activity, aggressive pain control, avoiding constipation, & not pushing through pain to minimize risk of post-operative chronic pain or injury. Possibility of reherniation was discussed.  We will work to minimize complications.     An educational handout further explaining the pathology & treatment options was given as well.  Questions were answered.  The patient expresses understanding & wishes to proceed with surgery.  Given his mild prostatism, like to place him on Flomax one week preop and postop to minimize the chance of urinary retention.  We will add gentamicin to cefazolin IV OCTOR given his history of infections in the past to minimize risk of joint issues with surgery.

## 2012-08-12 DIAGNOSIS — K4020 Bilateral inguinal hernia, without obstruction or gangrene, not specified as recurrent: Secondary | ICD-10-CM

## 2012-08-12 HISTORY — PX: LAPAROSCOPIC INGUINAL HERNIA REPAIR: SUR788

## 2012-08-12 HISTORY — PX: HERNIA REPAIR: SHX51

## 2012-08-30 ENCOUNTER — Ambulatory Visit (INDEPENDENT_AMBULATORY_CARE_PROVIDER_SITE_OTHER): Payer: Medicare Other | Admitting: Surgery

## 2012-08-30 ENCOUNTER — Encounter (INDEPENDENT_AMBULATORY_CARE_PROVIDER_SITE_OTHER): Payer: Self-pay | Admitting: Surgery

## 2012-08-30 VITALS — BP 134/76 | HR 72 | Temp 97.8°F | Resp 20 | Ht 66.0 in | Wt 153.4 lb

## 2012-08-30 DIAGNOSIS — K4020 Bilateral inguinal hernia, without obstruction or gangrene, not specified as recurrent: Secondary | ICD-10-CM

## 2012-08-30 NOTE — Patient Instructions (Addendum)
HERNIA REPAIR: POST OP INSTRUCTIONS  1. DIET: Follow a light bland diet the first 24 hours after arrival home, such as soup, liquids, crackers, etc.  Be sure to include lots of fluids daily.  Avoid fast food or heavy meals as your are more likely to get nauseated.  Eat a low fat the next few days after surgery. 2. Take your usually prescribed home medications unless otherwise directed. 3. PAIN CONTROL: a. Pain is best controlled by a usual combination of three different methods TOGETHER: i. Ice/Heat ii. Over the counter pain medication iii. Prescription pain medication b. Most patients will experience some swelling and bruising around the hernia(s) such as the bellybutton, groins, or old incisions.  Ice packs or heating pads (30-60 minutes up to 6 times a day) will help. Use ice for the first few days to help decrease swelling and bruising, then switch to heat to help relax tight/sore spots and speed recovery.  Some people prefer to use ice alone, heat alone, alternating between ice & heat.  Experiment to what works for you.  Swelling and bruising can take several weeks to resolve.   c. It is helpful to take an over-the-counter pain medication regularly for the first few weeks.  Choose one of the following that works best for you: i. Naproxen (Aleve, etc)  Two 220mg tabs twice a day ii. Ibuprofen (Advil, etc) Three 200mg tabs four times a day (every meal & bedtime) iii. Acetaminophen (Tylenol, etc) 325-650mg four times a day (every meal & bedtime) d. A  prescription for pain medication should be given to you upon discharge.  Take your pain medication as prescribed.  i. If you are having problems/concerns with the prescription medicine (does not control pain, nausea, vomiting, rash, itching, etc), please call us [phone] to see if we need to switch you to a different pain medicine that will work better for you and/or control your side effect better. ii. If you need a refill on your pain  medication, please contact your pharmacy.  They will contact our office to request authorization. Prescriptions will not be filled after 5 pm or on week-ends. 4. Avoid getting constipated.  Between the surgery and the pain medications, it is common to experience some constipation.  Increasing fluid intake and taking a fiber supplement (such as Metamucil, Citrucel, FiberCon, MiraLax, etc) 1-2 times a day regularly will usually help prevent this problem from occurring.  A mild laxative (prune juice, Milk of Magnesia, MiraLax, etc) should be taken according to package directions if there are no bowel movements after 48 hours.   5. Wash / shower every day.  You may shower over the dressings as they are waterproof.   6. Remove your waterproof bandages 5 days after surgery.  You may leave the incision open to air.  You may replace a dressing/Band-Aid to cover the incision for comfort if you wish.  Continue to shower over incision(s) after the dressing is off.    7. ACTIVITIES as tolerated:   a. You may resume regular (light) daily activities beginning the next day-such as daily self-care, walking, climbing stairs-gradually increasing activities as tolerated.  If you can walk 30 minutes without difficulty, it is safe to try more intense activity such as jogging, treadmill, bicycling, low-impact aerobics, swimming, etc. b. Save the most intensive and strenuous activity for last such as sit-ups, heavy lifting, contact sports, etc  Refrain from any heavy lifting or straining until you are off narcotics for pain control.     c. DO NOT PUSH THROUGH PAIN.  Let pain be your guide: If it hurts to do something, don't do it.  Pain is your body warning you to avoid that activity for another week until the pain goes down. d. You may drive when you are no longer taking prescription pain medication, you can comfortably wear a seatbelt, and you can safely maneuver your car and apply brakes. e. You may have sexual intercourse  when it is comfortable.  8. FOLLOW UP in our office a. Please call [facility] at [phone] to set up an appointment to see your surgeon in the office for a follow-up appointment approximately 2-3 weeks after your surgery. b. Make sure that you call for this appointment the day you arrive home to insure a convenient appointment time. 9.  IF YOU HAVE DISABILITY OR FAMILY LEAVE FORMS, BRING THEM TO THE OFFICE FOR PROCESSING.  DO NOT GIVE THEM TO YOUR DOCTOR.  WHEN TO CALL US [phone]: 1. Poor pain control 2. Reactions / problems with new medications (rash/itching, nausea, etc)  3. Fever over 101.5 F (38.5 C) 4. Inability to urinate 5. Nausea and/or vomiting 6. Worsening swelling or bruising 7. Continued bleeding from incision. 8. Increased pain, redness, or drainage from the incision   The clinic staff is available to answer your questions during regular business hours (8:30am-5pm).  Please don't hesitate to call and ask to speak to one of our nurses for clinical concerns.   If you have a medical emergency, go to the nearest emergency room or call 911.  A surgeon from [facility] is always on call at the hospitals in [place]  [facility] [address], [address], [place], [place]  [address] ?  [address] [address], ,    [address] MAIN: [phone] ? TOLL FREE: [phone] ? FAX: [phone] [email or web address]  Managing Pain  Pain after surgery or related to activity is often due to strain/injury to muscle, tendon, nerves and/or incisions.  This pain is usually short-term and will improve in a few months.   Many people find it helpful to do the following things TOGETHER to help speed the process of healing and to get back to regular activity more quickly:  1. Avoid heavy physical activity a.  no lifting greater than 20 pounds b. Do not "push through" the pain.  Listen to your body and avoid positions and maneuvers than  reproduce the pain c. Walking is okay as tolerated, but go slowly and stop when getting sore.  d. Remember: If it hurts to do it, then don't do it! 2. Take Anti-inflammatory medication  a. Take with food/snack around the clock for 1-2 weeks i. This helps the muscle and nerve tissues become less irritable and calm down faster b. Choose ONE of the following over-the-counter medications: i. Naproxen 220mg tabs (ex. Aleve) 1-2 pills twice a day  ii. Ibuprofen 200mg tabs (ex. Advil, Motrin) 3-4 pills with every meal and just before bedtime iii. Acetaminophen 500mg tabs (Tylenol) 1-2 pills with every meal and just before bedtime 3. Use a Heating pad or Ice/Cold Pack a. 4-6 times a day b. May use warm bath/hottub  or showers 4. Try Gentle Massage and/or Stretching  a. at the area of pain many times a day b. stop if you feel pain - do not overdo it  Try these steps together to help you body heal faster and avoid making things get worse.  Doing just one of these   things may not be enough.    If you are not getting better after two weeks or are noticing you are getting worse, contact our office for further advice; we may need to re-evaluate you & see what other things we can do to help.   

## 2012-08-30 NOTE — Progress Notes (Signed)
Subjective:     Patient ID: [name], male   DOB: 04-26-1926, 79 y.o.   MRN: [id number]  HPI  [name]  [birth year] [id number]  Patient Care Team: Massie Maroon, MD as PCP - General (Internal Medicine)  This patient is a 79 y.o.male who presents today for surgical evaluations/p lap BIH repair 08/12/2012  Patient comes in today doing pretty well.  Helping lift his 20 pound dog who also had surgery.  No pain nor discomfort with that.  Had moderate bruising but that is nearly gone away.  Mild soreness with activity but nothing too severe to require pain medications.  Urinating fine.  Moving bowels fine.  Back to her regular activity.  Patient Active Problem List   Diagnosis Date Noted  . BPH associated with mild nocturia 07/26/2012    Past Medical History  Diagnosis Date  . 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 Heart murmur 2012  . Parkinson's disease 03/20/1996  . Bilateral inguinal hernia (BIH) s/p lap BIH repair 08/12/2012 07/26/2012    Past Surgical History  Procedure Laterality Date  . Cataract eye surgery  08/25/1999  . Total knee arthroplasty  02/24/2002    right knee  . Replacement total knee  02/20/2003  . Revision total knee arthroplasty  08/08/2004    right knee infection  . Revision total knee arthroplasty  09/18/2004    right knee completed  . Brow lift and eyelids  03/06/2008    both eyes  . Hernia repair      History   Social History  . Marital Status: Married    Spouse Name: N/A    Number of Children: N/A  . Years of Education: N/A   Occupational History  . Not on file.   Social History Main Topics  . Smoking status: Former Smoker    Quit date: 07/26/1969  . Smokeless tobacco: Not on file  . Alcohol Use: Yes  . Drug Use: No  . Sexually Active: Not on file   Other Topics Concern  . Not on file   Social History Narrative  . No narrative on file    Family History  Problem Relation Age of Onset  . Kidney disease  Father   . COPD Sister   . Cancer Sister     Current Outpatient Prescriptions  Medication Sig Dispense Refill  . amoxicillin (AMOXIL) 500 MG capsule       . AZILECT 1 MG TABS       . carbidopa-levodopa (SINEMET IR) 25-100 MG per tablet       . carvedilol (COREG) 3.125 MG tablet       . CINNAMON PO Take by mouth. 1000mg  BID      . NEXIUM 40 MG capsule       . pravastatin (PRAVACHOL) 40 MG tablet       . tamsulosin (FLOMAX) 0.4 MG CAPS Take 1 capsule (0.4 mg total) by mouth daily. Start 1 week before surgery.  Continue until >1 weeks after surgery (2 weeks total) to decrease chance of urinary retention  14 capsule  1   No current facility-administered medications for this visit.     Allergies  Allergen Reactions  . Codeine Hives    BP 134/76  Pulse 72  Temp(Src) 97.8 F (36.6 C) (Temporal)  Resp 20  Ht 5\' 6"  (1.676 m)  Wt 153 lb 6.4 oz (69.582 kg)  BMI 24.77 kg/m2  No results found.  Review of Systems  Constitutional: Negative for fever, chills and diaphoresis.  HENT: Negative for sore throat, trouble swallowing and neck pain.   Eyes: Negative for photophobia and visual disturbance.  Respiratory: Negative for choking and shortness of breath.   Cardiovascular: Negative for chest pain and palpitations.  Gastrointestinal: Negative for nausea, vomiting, abdominal distention, anal bleeding and rectal pain.  Genitourinary: Negative for dysuria, urgency, difficulty urinating and testicular pain.  Musculoskeletal: Negative for myalgias, arthralgias and gait problem.  Skin: Negative for color change and rash.  Neurological: Negative for dizziness, speech difficulty, weakness and numbness.  Hematological: Negative for adenopathy.  Psychiatric/Behavioral: Negative for hallucinations, confusion and agitation.       Objective:   Physical Exam  Constitutional: He is oriented to person, place, and time. He appears well-developed and well-nourished. No distress.  HENT:  Head:  Normocephalic.  Mouth/Throat: Oropharynx is clear and moist. No oropharyngeal exudate.  Eyes: Conjunctivae and EOM are normal. Pupils are equal, round, and reactive to light. No scleral icterus.  Neck: Normal range of motion. No tracheal deviation present.  Cardiovascular: Normal rate, normal heart sounds and intact distal pulses.   Pulmonary/Chest: Effort normal. No respiratory distress.  Abdominal: Soft. He exhibits no distension. There is no tenderness. Hernia confirmed negative in the right inguinal area and confirmed negative in the left inguinal area.  Incisions clean with normal healing ridges.  No hernias  Musculoskeletal: Normal range of motion. He exhibits no tenderness.  Neurological: He is alert and oriented to person, place, and time. No cranial nerve deficit. He exhibits normal muscle tone. Coordination normal.  Skin: Skin is warm and dry. No rash noted. He is not diaphoretic.  Psychiatric: He has a normal mood and affect. His behavior is normal.       Assessment:     Almost 3 weeks status post laparoscopic bilateral inguinal hernia repairs with mesh.  Recovering well.     Plan:     Increase activity as tolerated to regular activity.  Low impact exercise such as walking an hour a day at least ideal.  Do not push through pain.  Diet as tolerated.  Low fat high fiber diet ideal.  Bowel regimen with 30 g fiber a day and fiber supplement as needed to avoid problems.  Return to clinic as needed.   Instructions discussed.  Followup with primary care physician for other health issues as would normally be done.  Questions answered.  The patient expressed understanding and appreciation

## 2012-10-27 ENCOUNTER — Encounter: Payer: Self-pay | Admitting: Neurology

## 2012-10-27 ENCOUNTER — Ambulatory Visit (INDEPENDENT_AMBULATORY_CARE_PROVIDER_SITE_OTHER): Payer: Medicare Other | Admitting: Neurology

## 2012-10-27 VITALS — BP 135/83 | HR 61 | Ht 65.0 in | Wt 153.0 lb

## 2012-10-27 DIAGNOSIS — G20 Parkinson's disease: Secondary | ICD-10-CM

## 2012-10-27 DIAGNOSIS — G20A1 Parkinson's disease without dyskinesia, without mention of fluctuations: Secondary | ICD-10-CM

## 2012-10-27 MED ORDER — CARBIDOPA-LEVODOPA 25-100 MG PO TABS: 1.5000 | ORAL_TABLET | Freq: Four times a day (QID) | ORAL | Status: DC

## 2012-10-27 MED ORDER — RASAGILINE MESYLATE 1 MG PO TABS: 1.0000 mg | ORAL_TABLET | Freq: Every day | ORAL | Status: DC

## 2012-10-27 NOTE — Progress Notes (Signed)
Subjective:    Patient ID: [name] is a 79 y.o. male.  HPI   Interim history:   [name] is a very pleasant 79 year old right-handed gentleman who presents for followup consultation of his Parkinson's disease, right sided. He is unaccompanied today. This is his first visit with me and she previously followed with Dr. Avie Echevaria and was last seen by him on 04/29/2012, at which time Dr. Sandria Manly felt that he was doing fairly well. He had some dyskinesias but had a reaction in the past on amantadine including delirium (admitted to [facility] from 7/2/-10/24/04). Dr. love did not change his medications. These are rasagiline 1 mg once daily, carvedilol 3.125 mg twice daily, Sinemet 25-100 mg strength 1-1/2 pills 3 times a day at 7 AM, 11 AM, 4 PM. He is on Nexium 40 mg daily, Pravachol 40 mg daily, baby aspirin. He has an underlying medical history of hypertension, hyperlipidemia, and Parkinson's disease. He had bilateral TKAs (2003 and '04)  I reviewed Dr. Imagene Gurney prior notes and the patient's records and below is a summary of that review:  76 year old right-handed gentleman with a 25 year history of Parkinson's disease. He was on multiple different medications in the past, including Mirapex, Requip, Comtan, and had side effects. He even was hospitalized in the past because of delirium while on amantadine. He was on bromocriptine in the distant past. Sinemet was restarted in March 2008 and he denies hallucinations, nausea, diarrhea, swallowing or speech difficulties. In January 2014 his MMSE was 30, clock drawing was 4, animal fluency was 22.  He walks >2 miles a day and tries to eat healthy and stay active, memory is stable. His family has told him that is voice is softer. He has not had any falls. No problems driving.   His Past Medical History Is Significant For: Past Medical History  Diagnosis Date  . 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 Heart murmur 2012  . Parkinson's  disease 03/20/1996  . Bilateral inguinal hernia (BIH) s/p lap BIH repair 08/12/2012 07/26/2012    His Past Surgical History Is Significant For: Past Surgical History  Procedure Laterality Date  . Cataract eye surgery  08/25/1999  . Total knee arthroplasty  02/24/2002    right knee  . Replacement total knee  02/20/2003  . Revision total knee arthroplasty  08/08/2004    right knee infection  . Revision total knee arthroplasty  09/18/2004    right knee completed  . Brow lift and eyelids  03/06/2008    both eyes  . Laparoscopic inguinal hernia repair Bilateral 08/12/2012    His Family History Is Significant For: Family History  Problem Relation Age of Onset  . Kidney disease Father   . COPD Sister   . Cancer Sister     His Social History Is Significant For: History   Social History  . Marital Status: Married    Spouse Name: N/A    Number of Children: N/A  . Years of Education: N/A   Social History Main Topics  . Smoking status: Former Smoker    Quit date: 07/26/1969  . Smokeless tobacco: None  . Alcohol Use: Yes  . Drug Use: No  . Sexually Active: None   Other Topics Concern  . 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 He has been married for 55 years and has 5 children. He drinks about 1 cup of caffeine daily and 1  glass of wine per day. He has never used illicit drugs.  Quit tonacco in 1971.    His Allergies Are:  Allergies  Allergen Reactions  . Codeine Hives  :   His Current Medications Are:  Outpatient Encounter Prescriptions as of 10/27/2012  Medication Sig Dispense Refill  . aspirin 81 MG EC tablet Take 81 mg by mouth daily. Swallow whole.      . carbidopa-levodopa (SINEMET IR) 25-100 MG per tablet Take 1.5 tablets by mouth 4 (four) times daily. At 7, 11, 3PM and 7:30 PM  360 tablet  3  . carvedilol (COREG) 3.125 MG tablet       . cholecalciferol (VITAMIN D) 1000 UNITS tablet Take 1,000 Units by mouth daily.      Marland Kitchen CINNAMON PO Take by  mouth. 1000mg  BID      . NEXIUM 40 MG capsule       . pravastatin (PRAVACHOL) 40 MG tablet       . rasagiline (AZILECT) 1 MG TABS Take 1 tablet (1 mg total) by mouth daily.  90 tablet  3  . [DISCONTINUED] AZILECT 1 MG TABS       . [DISCONTINUED] carbidopa-levodopa (SINEMET IR) 25-100 MG per tablet       . [DISCONTINUED] amoxicillin (AMOXIL) 500 MG capsule       . [DISCONTINUED] tamsulosin (FLOMAX) 0.4 MG CAPS Take 1 capsule (0.4 mg total) by mouth daily. Start 1 week before surgery.  Continue until >1 weeks after surgery (2 weeks total) to decrease chance of urinary retention  14 capsule  1  . [DISCONTINUED] tobramycin-dexamethasone (TOBRADEX) ophthalmic solution        No facility-administered encounter medications on file as of 10/27/2012.  : Review of Systems  Cardiovascular:       MurMur  Neurological: Positive for tremors.    Objective:  Neurologic Exam  Physical Exam Physical Examination:   Filed Vitals:   10/27/12 1125  BP: 135/83  Pulse: 61    General Examination: The patient is a very pleasant 79 y.o. male in no acute distress.  HEENT: Normocephalic, atraumatic, pupils are equal, round and reactive to light and accommodation. Funduscopic exam is normal with sharp disc margins noted. Extraocular tracking shows mild saccadic breakdown without nystagmus noted. There is limitation to upper gaze. There is mild decrease in eye blink rate. Hearing is intact. Tympanic membranes are clear bilaterally. Face is symmetric with mild facial masking and normal facial sensation. There is a mild to at times moderate intermittent lower jaw tremor. Neck is mildly rigid with intact passive ROM. There are no carotid bruits on auscultation. Oropharynx exam reveals mild mouth dryness. No significant airway crowding is noted. Mallampati is class II. Tongue protrudes centrally and palate elevates symmetrically.   There is no drooling.   Chest: is clear to auscultation without wheezing, rhonchi or  crackles noted.  Heart: sounds are regular and normal without murmurs, rubs or gallops noted.   Abdomen: is soft, non-tender and non-distended with normal bowel sounds appreciated on auscultation.  Extremities: There is no pitting edema in the distal lower extremities bilaterally. Pedal pulses are intact. There are no varicose veins.  Skin: is warm and dry with no trophic changes noted. Age-related changes are noted on the skin.   Musculoskeletal: exam reveals no obvious joint deformities, tenderness, joint swelling or erythema.  Neurologically:  Mental status: The patient is awake and alert, paying good  attention. He is able to completely provide the history. He is oriented  to: person, place, time/date, situation, day of week, month of year and year. His memory, attention, language and knowledge are intact. There is no aphasia, agnosia, apraxia or anomia. There is a mild degree of bradyphrenia. Speech is moderately hypophonic with no dysarthria noted. Mood is congruent and affect is normal.    Cranial nerves are as described above under HEENT exam. In addition, shoulder shrug is normal with equal shoulder height noted.  Motor exam: Normal bulk, and strength for age is noted. There are no dyskinesias noted.   Tone is mildly rigid with presence of cogwheeling in the upper extremities. There is overall mild bradykinesia. There is no drift or rebound.  There is a moderate resting tremor in the upper extremities and a mild resting tremor in the right lower extremity. The tremor is persistent.  Romberg is negative.  Reflexes are 1+ in the upper extremities and absent in the lower extremities.   Fine motor skills exam: Finger taps are moderately impaired on the right and mildly impaired on the left. Hand movements are mildly impaired on the right and mildly impaired on the left. RAP (rapid alternating patting) is mildly impaired on the right and mildly impaired on the left. Foot taps are moderately  impaired on the right and moderately impaired on the left. Foot agility (in the form of heel stomping) is moderately impaired on the right and moderately impaired on the left.    Cerebellar testing shows no dysmetria or intention tremor on finger to nose testing. Heel to shin is unremarkable bilaterally. There is no truncal or gait ataxia.   Sensory exam is intact to light touch, pinprick, vibration, temperature sense and proprioception in the upper and lower extremities.   Gait, station and balance: He stands up from the seated position with mild difficulty and does not need to push up with His hands. He needs no assistance. No veering to one side is noted. He is not noted to lean to the side. Posture is mildly stooped. Stance is narrow-based. He walks with decrease in stride length and pace and decreased arm swing on both sides. He turns in en bloc. Balance is not significantly impaired.       Assessment and Plan:   Assessment and Plan:  In summary, [name] is a very pleasant 79 y.o.-year old male with a history of Parkinson's disease, right-sided predominant of over 20 years' duration. His physical exam is stable and has not progressed in the last 6 months. He is doing fairly well at this time and I reassured the patient in that regard. Nevertheless he does have significant residual tremor and rigidity andI had a long chat with the patient about His symptoms, my findings and the diagnosis of parkinsonism/Parksinson's disease, its prognosis and treatment options. We talked about medical treatments and non-pharmacological approaches. We talked about maintaining a healthy lifestyle in general. I encouraged the patient to eat healthy, exercise daily and keep well hydrated, to keep a scheduled bedtime and wake time routine, to not skip any meals and eat healthy snacks in between meals and to have protein with every meal. In particular, I stressed the importance of regular exercise, within of  course the patient's own mobility limitations. Because her is a long gap between his last daytime dose and his first morning dose the next day I suggested a trial of 4 times a day scheduling  Of his carbidopa-levodopa. To that end I suggested that he take 1-1/2 pills 4 times a day, namely  at 7, 11, 3 PM and 7:30 PM. This should not interfere with his mealtimes and I would like for him to see if that makes a difference. While he does not always feel the medication taken he does state that he notices it wear off. Most of the time this results in flareup of his tremor. Of note he has been advised by his cardiologist to reduce his Coreg to once daily but he noticed a flareup of his tremor and increased it back to twice daily. Since he is doing so well I would like to see him back in 6 months from now, sooner if the need arises. I've renewed his prescription for Azilect and Sinemet for 90 day prescriptions with refills. He is encouraged to call us with any interim questions, concerns, problems or updates and refill requests. He was in agreement.

## 2012-10-27 NOTE — Patient Instructions (Addendum)
I think overall you are doing fairly well but I do want to suggest a few things today:  Remember to drink plenty of fluid, eat healthy meals and do not skip any meals. Try to eat protein with a every meal and eat a healthy snack such as fruit or nuts in between meals. Try to keep a regular sleep-wake schedule and try to exercise daily, particularly in the form of walking, 20-30 minutes a day, if you can.   Engage in social activities in your community and with your family and try to keep up with current events by reading the newspaper or watching the news.   As far as your medications are concerned, I would like to suggest: sinemet 25/100 mg 1.5 pills 4 times a day: 7, 11, 3 PM and 7:30 PM.   As far as diagnostic testing: speech therapy referral  I would like to see you back in 6 months, sooner if we need to. Please call us with any interim questions, concerns, problems, updates or refill requests.  Brett Canales is my clinical assistant and will answer any of your questions and relay your messages to me and also relay most of my messages to you.  Our phone number is [phone]. We also have an after hours call service for urgent matters and there is a physician on-call for urgent questions. For any emergencies you know to call 911 or go to the nearest emergency room.

## 2012-11-02 ENCOUNTER — Ambulatory Visit: Payer: Medicare Other | Attending: Neurology | Admitting: Speech Pathology

## 2012-11-02 DIAGNOSIS — IMO0001 Reserved for inherently not codable concepts without codable children: Secondary | ICD-10-CM | POA: Insufficient documentation

## 2012-11-02 DIAGNOSIS — R471 Dysarthria and anarthria: Secondary | ICD-10-CM | POA: Insufficient documentation

## 2012-11-08 ENCOUNTER — Ambulatory Visit: Payer: Medicare Other

## 2012-11-11 ENCOUNTER — Ambulatory Visit: Payer: Medicare Other

## 2012-11-16 ENCOUNTER — Ambulatory Visit: Payer: Medicare Other | Admitting: Speech Pathology

## 2012-11-23 ENCOUNTER — Other Ambulatory Visit: Payer: Self-pay

## 2012-11-25 ENCOUNTER — Ambulatory Visit: Payer: Medicare Other | Attending: Neurology

## 2012-11-25 DIAGNOSIS — IMO0001 Reserved for inherently not codable concepts without codable children: Secondary | ICD-10-CM | POA: Insufficient documentation

## 2012-11-25 DIAGNOSIS — R471 Dysarthria and anarthria: Secondary | ICD-10-CM | POA: Insufficient documentation

## 2012-11-28 ENCOUNTER — Encounter: Payer: PRIVATE HEALTH INSURANCE | Admitting: Speech Pathology

## 2012-11-30 ENCOUNTER — Encounter: Payer: PRIVATE HEALTH INSURANCE | Admitting: Speech Pathology

## 2013-02-23 ENCOUNTER — Other Ambulatory Visit: Payer: Self-pay

## 2013-04-27 ENCOUNTER — Ambulatory Visit (INDEPENDENT_AMBULATORY_CARE_PROVIDER_SITE_OTHER): Payer: Medicare Other | Admitting: Neurology

## 2013-04-27 ENCOUNTER — Encounter: Payer: Self-pay | Admitting: Neurology

## 2013-04-27 VITALS — BP 138/76 | HR 63 | Ht 65.0 in | Wt 156.0 lb

## 2013-04-27 DIAGNOSIS — G20 Parkinson's disease: Secondary | ICD-10-CM

## 2013-04-27 DIAGNOSIS — J069 Acute upper respiratory infection, unspecified: Secondary | ICD-10-CM

## 2013-04-27 MED ORDER — CARBIDOPA-LEVODOPA 25-100 MG PO TABS
1.5000 | ORAL_TABLET | Freq: Three times a day (TID) | ORAL | Status: DC
Start: 2013-04-27 — End: 2013-10-03

## 2013-04-27 MED ORDER — RASAGILINE MESYLATE 1 MG PO TABS: 1.0000 mg | ORAL_TABLET | Freq: Every day | ORAL | Status: DC

## 2013-04-27 NOTE — Progress Notes (Signed)
Subjective:    Patient ID: [name] is a 80 y.o. male.  HPI  Interim history:   [name] is a very pleasant 80 year old right-handed gentleman who presents for followup consultation of his right-sided predominant Parkinson's disease of over 25 years duration. He is unaccompanied today. I first met him on 10/27/2012, at which time I felt he was doing fairly well. However, I did increase his Sinemet from 3 times a day to 4 times a day scheduling. He was taking 1-1/2 pills at each dose. I suggested that he take 1-1/2 pills at 7, 11, 3 PM and 7:30 PM. I kept him on the same dose of Azilect. I also referred him for speech therapy because of his hypophonia.   Today, he reports that he has been fighting an URI since mid-December and it started with a cold, but exacerbated. He was treated with a Z-pack. He had to go back to taking C/L tid, as he had insomnia with the last dose at 7 PM. He has had a setback in his PD symptoms, particularly his tremors since his illness, but he feels, he is turning the corner. He drives, and reports no recent issues with mood, memory or involuntary movements. He has occasional constipation for which he takes a over-the-counter stool softener as needed and glycerin suppositories as needed.  He previously followed with Dr. Morene Antu and was last seen by him on 04/29/2012, at which time Dr. Erling Cruz felt that he was doing fairly well. He had some dyskinesias but had a reaction in the past on amantadine including delirium (admitted to [facility] from 7/2/-10/24/04). Dr. Erling Cruz did not change his medications at the time.  He has an underlying medical history of hypertension, hyperlipidemia, and Parkinson's disease. He had bilateral TKAs (2003 and '04)  He has a 25 year history of Parkinson's disease. He was on multiple different medications in the past, including Mirapex, Requip, Comtan, and had side effects. He even was hospitalized in the past because of delirium while on  amantadine. He was on bromocriptine in the distant past. Sinemet was restarted in March 2008 and he denied hallucinations, nausea, diarrhea, swallowing or speech difficulties. In January 2014 his MMSE was 30, clock drawing was 4, animal fluency was 22.   His Past Medical History Is Significant For: Past Medical History  Diagnosis Date  . 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 Heart murmur 2012  . Parkinson's disease 03/20/1996  . Bilateral inguinal hernia (BIH) s/p lap BIH repair 08/12/2012 07/26/2012    His Past Surgical History Is Significant For: Past Surgical History  Procedure Laterality Date  . Cataract eye surgery  08/25/1999  . Total knee arthroplasty  02/24/2002    right knee  . Replacement total knee  02/20/2003  . Revision total knee arthroplasty  08/08/2004    right knee infection  . Revision total knee arthroplasty  09/18/2004    right knee completed  . Brow lift and eyelids  03/06/2008    both eyes  . Laparoscopic inguinal hernia repair Bilateral 08/12/2012    His Family History Is Significant For: Family History  Problem Relation Age of Onset  . Kidney disease Father   . COPD Sister   . Cancer Sister     His Social History Is Significant For: History   Social History  . Marital Status: Married    Spouse Name: N/A    Number of Children: N/A  . Years of Education: N/A  Social History Main Topics  . Smoking status: Former Smoker    Quit date: 07/26/1969  . Smokeless tobacco: None  . Alcohol Use: Yes  . Drug Use: No  . Sexual Activity: None   Other Topics Concern  . 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 He has been married for 55 years and has 5 children. He drinks about 1 cup of caffeine daily and 1 glass of wine per day. He has never used illicit drugs.  Quit tonacco in 1971.    His Allergies Are:  Allergies  Allergen Reactions  . Codeine Hives  :   His Current Medications Are:  Outpatient Encounter  Prescriptions as of 04/27/2013  Medication Sig  . aspirin 81 MG EC tablet Take 81 mg by mouth daily. Swallow whole.  . carbidopa-levodopa (SINEMET IR) 25-100 MG per tablet Take 1.5 tablets by mouth 4 (four) times daily. At 7, 11, 3PM and 7:30 PM  . carvedilol (COREG) 3.125 MG tablet   . cholecalciferol (VITAMIN D) 1000 UNITS tablet Take 1,000 Units by mouth daily.  Marland Kitchen CINNAMON PO Take by mouth. $RemoveB'1000mg'[id number]$  BID  . NEXIUM 40 MG capsule   . pravastatin (PRAVACHOL) 40 MG tablet   . rasagiline (AZILECT) 1 MG TABS Take 1 tablet (1 mg total) by mouth daily.  :  Review of Systems:  Out of a complete 14 point review of systems, all are reviewed and negative with the exception of these symptoms as listed below:   Review of Systems  Constitutional: Negative.   HENT: Negative.   Eyes: Negative.   Respiratory: Negative.   Cardiovascular:       Murmur  Gastrointestinal: Positive for constipation.  Endocrine: Negative.   Genitourinary: Negative.   Musculoskeletal: Negative.   Skin: Negative.   Allergic/Immunologic: Negative.   Neurological: Positive for tremors.  Hematological: Negative.   Psychiatric/Behavioral: Negative.     Objective:  Neurologic Exam  Physical Exam Physical Examination:   Filed Vitals:   04/27/13 1136  BP: 138/76  Pulse: 63    General Examination: The patient is a very pleasant 80 y.o. male in no acute distress.  HEENT: Normocephalic, atraumatic, pupils are equal, round and reactive to light and accommodation. Funduscopic exam is normal with sharp disc margins noted. Extraocular tracking shows mild saccadic breakdown without nystagmus noted. There is limitation to upper gaze. There is mild decrease in eye blink rate. Hearing is intact. Face is symmetric with mild facial masking and normal facial sensation. There is a mild to at times moderate intermittent lower jaw tremor. Neck is mildly rigid with intact passive ROM. There are no carotid bruits on auscultation.  Oropharynx exam reveals mild mouth dryness. No significant airway crowding is noted. Mallampati is class II. Tongue protrudes centrally and palate elevates symmetrically.   There is no drooling.   Chest: is clear to auscultation without wheezing, rhonchi or crackles noted.  Heart: sounds are regular and normal without murmurs, rubs or gallops noted.   Abdomen: is soft, non-tender and non-distended with normal bowel sounds appreciated on auscultation.  Extremities: There is no pitting edema in the distal lower extremities bilaterally. Pedal pulses are intact. There are no varicose veins.  Skin: is warm and dry with no trophic changes noted. Age-related changes are noted on the skin.   Musculoskeletal: exam reveals no obvious joint deformities, tenderness, joint swelling or erythema.  Neurologically:  Mental status: The patient is awake and alert, paying good  attention. He  is able to completely provide the history. He is oriented to: person, place, time/date, situation, day of week, month of year and year. His memory, attention, language and knowledge are intact. There is no aphasia, agnosia, apraxia or anomia. There is a mild degree of bradyphrenia. Speech is moderately hypophonic with no dysarthria noted. Mood is congruent and affect is normal.    Cranial nerves are as described above under HEENT exam. In addition, shoulder shrug is normal with equal shoulder height noted.  Motor exam: Normal bulk, and strength for age is noted. There are no dyskinesias noted.   Tone is mildly rigid with presence of cogwheeling in the upper extremities. There is overall mild bradykinesia. There is no drift or rebound.  There is a moderate resting tremor in the upper extremities and a mild resting tremor in the right lower extremity. The tremor is persistent.  Romberg is negative.  Reflexes are 1+ in the upper extremities, trace in the knees and absent in the ankles.    Fine motor skills exam: Finger taps  are moderately impaired on the right and mildly impaired on the left. Hand movements are mildly impaired on the right and mildly impaired on the left. RAP (rapid alternating patting) is mildly impaired on the right and mildly impaired on the left. Foot taps are moderately impaired on the right and moderately impaired on the left. Foot agility (in the form of heel stomping) is mildly impaired on the right and minimally impaired on the left.    Cerebellar testing shows no dysmetria or intention tremor on finger to nose testing. There is no truncal or gait ataxia.   Sensory exam is intact to light touch, pinprick, vibration, temperature sense in the upper and lower extremities.   Gait, station and balance: He stands up from the seated position with mild difficulty and does not need to push up with His hands. He needs no assistance. No veering to one side is noted. He is not noted to lean to the side. Posture is mildly stooped. Stance is narrow-based. He walks with decrease in stride length and pace and decreased arm swing on both sides. He turns in en bloc. Balance is not significantly impaired.      Assessment and Plan:   In summary, KEVAL NAM is a very pleasant 80 year old gentleman with a history of Parkinson's disease, right-sided predominant of over 25 years' duration. His physical exam is stable and has not progressed in the last 6 months. He is doing fairly well at this time and I reassured the patient in that regard. He had a recent setback in his symptoms do to her protracted upper respiratory infection. He is doing better in that regard and feels he has turned the corner. I again had a long chat with the patient regarding his diagnosis, medical and nonpharmacological treatments. I encouraged the patient to eat healthy, exercise daily and keep well hydrated, to keep a scheduled bedtime and wake time routine, to not skip any meals and eat healthy snacks in between meals and to have protein  with every meal. In particular, I stressed the importance of regular exercise, within of course the patient's own mobility limitations. I suggested that he continue taking Sinemet 3 times a day. He was not able to tolerate a later dose of Sinemet because of insomnia. In the past she had side effects with a lot of other medications and does seem to be sensitive to medications and medication changes. I would like to  see him back in 6 months from now, sooner if the need arises. I renewed his prescription for Azilect and Sinemet for 90 day prescriptions with refills. He is encouraged to call us with any interim questions, concerns, problems or updates and refill requests. He was in agreement.

## 2013-04-27 NOTE — Patient Instructions (Addendum)
I think your Parkinson's disease has remained fairly stable, which is reassuring. Nevertheless, as you know, this disease does progress with time. It can affect your balance, your memory, your mood, your bowel and bladder function, your posture, balance and walking. Overall you are doing fairly well but I do want to suggest a few things today:  Remember to drink plenty of fluid, eat healthy meals and do not skip any meals. Try to eat protein with a every meal and eat a healthy snack such as fruit or nuts in between meals. Try to keep a regular sleep-wake schedule and try to exercise daily, particularly in the form of walking, 20-30 minutes a day, if you can.   Taking your medication on schedule is key.   Try to stay active physically and mentally. Engage in social activities in your community and with your family and try to keep up with current events by reading the newspaper or watching the news. Try to do word puzzles and you may like to do word puzzles and brain games on the computer such as on [email or web address]   As far as your medications are concerned, I would like to suggest that you take your current medication with the following additional changes: no change, take sinemet 1.5 pills three times a day, away from your mealtimes.    As far as diagnostic testing, I will order: no new test needed from my end of things.   I would like to see you back in 6 months, sooner if we need to. Please call us with any interim questions, concerns, problems, updates or refill requests.  Our nursing staff will answer any of your questions and relay your messages to me and also relay most of my messages to you.  Our phone number is [phone]. We also have an after hours call service for urgent matters and there is a physician on-call for urgent questions, that cannot wait till the next work day. For any emergencies you know to call 911 or go to the nearest emergency room.

## 2013-05-21 DIAGNOSIS — J069 Acute upper respiratory infection, unspecified: Secondary | ICD-10-CM

## 2013-05-21 HISTORY — DX: Acute upper respiratory infection, unspecified: J06.9

## 2013-09-12 ENCOUNTER — Encounter: Payer: Self-pay | Admitting: Internal Medicine

## 2013-09-15 ENCOUNTER — Other Ambulatory Visit: Payer: Self-pay | Admitting: Neurology

## 2013-09-18 ENCOUNTER — Other Ambulatory Visit: Payer: Self-pay | Admitting: Neurology

## 2013-10-02 ENCOUNTER — Telehealth: Payer: Self-pay | Admitting: *Deleted

## 2013-10-02 DIAGNOSIS — G20 Parkinson's disease: Secondary | ICD-10-CM

## 2013-10-03 MED ORDER — CARBIDOPA-LEVODOPA 25-100 MG PO TABS: 2.0000 | ORAL_TABLET | Freq: Three times a day (TID) | ORAL | Status: DC

## 2013-10-03 NOTE — Telephone Encounter (Signed)
Please advise patient that it is okay to increase his Sinemet 25/100 mg strength to 2 pills 3 times a day, namely at 7 AM, 11 AM and 4 PM. Please ask them to watch for side effects including sleepiness, lightheadedness, drop in blood pressure and increase or recurrence of hallucinations. Rx sent to pharm. Star Age, MD, PhD [facility])

## 2013-10-03 NOTE — Telephone Encounter (Signed)
Called pt and pt stated that he would like for Dr. Rexene Alberts to increase his sinemet 25-100 mg to 2 tab TID. Pt stated that he is not taking sinemet 25/100 mg 1 1/2 tab TID and that pt's tremors get worst before he take that next dose which is at 7 am, 11 am and 4 pm. Please advise

## 2013-10-04 NOTE — Telephone Encounter (Signed)
Called pt to inform him per Dr. Rexene Alberts that it was ok for pt to increase Sinemet 25/100 mg to 2 pills 3 times a day, at 7 am, 11 am and 4 pm and to watch for side effects, such as sleepiness, lightheadedness, drop or increase in blood pressure or reoccurrence of hallucinations and pt's Rx has already been faxed to pharmacy. I advised the pt that if he has any other problems, questions or concerns to call the office. Pt verbalized understanding.

## 2013-10-25 ENCOUNTER — Encounter: Payer: Self-pay | Admitting: Neurology

## 2013-10-25 ENCOUNTER — Ambulatory Visit (INDEPENDENT_AMBULATORY_CARE_PROVIDER_SITE_OTHER): Payer: Medicare Other | Admitting: Neurology

## 2013-10-25 VITALS — BP 152/70 | HR 68 | Temp 97.9°F | Ht 65.0 in | Wt 154.0 lb

## 2013-10-25 DIAGNOSIS — G20 Parkinson's disease: Secondary | ICD-10-CM

## 2013-10-25 NOTE — Patient Instructions (Signed)
I think your Parkinson's disease has remained fairly stable, which is reassuring. Nevertheless, as you know, this disease does progress with time. It can affect your balance, your memory, your mood, your bowel and bladder function, your posture, balance and walking. Overall you are doing fairly well but I do want to suggest a few things today:  Remember to drink plenty of fluid, eat healthy meals and do not skip any meals. Try to eat protein with a every meal and eat a healthy snack such as fruit or nuts in between meals. Try to keep a regular sleep-wake schedule and try to exercise daily, particularly in the form of walking, 20-30 minutes a day, if you can.   Taking your medication on schedule is key.   Try to stay active physically and mentally. Engage in social activities in your community and with your family and try to keep up with current events by reading the newspaper or watching the news. Try to do word puzzles and you may like to do word puzzles and brain games on the computer such as on [email or web address]   As far as your medications are concerned, I would like to suggest that you take your current medication with the following additional changes: no change, take azilect once daily and sinemet 2 pills 3 times a day.   As far as diagnostic testing, I will order: no new test needed.   I would like to see you back in 6 months, sooner if we need to. Please call us with any interim questions, concerns, problems, updates or refill requests.  Please also call us for any test results so we can go over those with you on the phone. Our nursing staff will answer any of your questions and relay your messages to me and also relay most of my messages to you.  Our phone number is [phone]. We also have an after hours call service for urgent matters and there is a physician on-call for urgent questions, that cannot wait till the next work day. For any emergencies you know to call 911 or go to the nearest  emergency room.

## 2013-10-25 NOTE — Progress Notes (Signed)
Subjective:    Patient ID: [name] is a 80 y.o. male.  HPI    Interim history:   [name] is a very pleasant 80 year old right-handed gentleman with an underlying medical history of hypertension, hyperlipidemia, and Parkinson's disease, status post inguinal hernia repair bilaterally (08/12/12), cataract surgeries, eyelid surgery, and bilateral TKAs (2003 and '04), who presents for followup consultation of his advanced, right-sided predominant Parkinson's disease of over 25 years duration. He is unaccompanied today. I last saw him on 04/27/2013, at which time I felt she had remained stable. He did have a recent set back because of a protracted upper respiratory infection but had been feeling better in that regard. I suggested he continue Sinemet 3 times a day because he did not tolerate a later dose of Sinemet because of insomnia in the past. I also suggested he continue with Azilect. He called in June 2015 requesting an increase in his Sinemet to 2 pills 3 times a day. I suggested he try this but watch for side effects such as sleepiness, lightheadedness, drop in blood pressure or recurrence of hallucinations. Today, he reports improved tremor on the increased dose of C/L, and he takes 2 pills at 7 AM, 11 AM and 4 PM. He drives, and reports no recent issues with driving, mood, memory or involuntary movements. He has occasional constipation for which he takes a over-the-counter stool softener as needed and glycerin suppositories as needed. He takes an afternoon nap around 4 PM. He goes to bed at 10 to 10:30 PM and falls asleep quickly. He sees Dr. Einar Gip and has had C.Doppler, echo and has a referral to see Dr. Rayann Heman. He may need a PM, he states. He fatigues easily. He had some stressors lately. He lost his nephew last week from lung cancer and he was like a son to the patient. He is coping ok. Sometimes he has difficulty going back to sleep in the early morning hours when he gets up to use  the bathroom around 3:30 AM.   I first met him on 10/27/2012, at which time I felt he was doing fairly well. However, I did increase his Sinemet from 3 times a day to 4 times a day scheduling. He was taking 1-1/2 pills at each dose. I suggested that he take 1-1/2 pills at 7, 11, 3 PM and 7:30 PM. I kept him on the same dose of Azilect. I also referred him for speech therapy because of his hypophonia.  He previously followed with Dr. Morene Antu and was last seen by him on 04/29/2012, at which time Dr. Erling Cruz felt that he was doing fairly well. He had some dyskinesias but had a reaction in the past on amantadine including delirium (admitted to [facility] from 7/2/-10/24/04). Dr. Erling Cruz did not change his medications at the time.  He has a 25 year history of Parkinson's disease. He was on multiple different medications in the past, including Mirapex, Requip, Comtan, and had side effects. He even was hospitalized in the past because of delirium while on amantadine. He was on bromocriptine in the distant past. Sinemet was restarted in March 2008 and he denied hallucinations, nausea, diarrhea, swallowing or speech difficulties. In January 2014 his MMSE was 30, clock drawing was 4, animal fluency was 22.   His Past Medical History Is Significant For: Past Medical History  Diagnosis Date  . 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 Heart murmur 2012  . Parkinson's disease 03/20/1996  .  Bilateral inguinal hernia (BIH) s/p lap BIH repair 08/12/2012 07/26/2012  . Trifascicular block   . Essential hypertension, benign   . Aortic valve disorders   . Nonspecific abnormal electrocardiogram (ECG) (EKG)   . Malaise and fatigue   . Hollenhorst plaque, left eye   . Hyperlipidemia   . Coarse tremors     Benign essential tremors. D/C Coreg due to bradycardia and fatigue  . Heart block   . Angina pectoris   . Aortic valve regurgitation   . URI (upper respiratory infection) 05/2013    lasted for most of 2 - 2 1/2  months  . Chronotropic incompetence     His Past Surgical History Is Significant For: Past Surgical History  Procedure Laterality Date  . Cataract eye surgery  08/25/1999  . Total knee arthroplasty  02/24/2002    right knee  . Replacement total knee  02/20/2003  . Revision total knee arthroplasty  08/08/2004    right knee infection  . Revision total knee arthroplasty  09/18/2004    right knee completed  . Brow lift and eyelids  03/06/2008    both eyes  . Laparoscopic inguinal hernia repair Bilateral 08/12/2012  . Hernia repair Bilateral 08/12/12    His Family History Is Significant For: Family History  Problem Relation Age of Onset  . Kidney disease Father   . Cancer Father     renal  . COPD Sister   . Cancer Sister 66    Breast  . Congestive Heart Failure Mother     His Social History Is Significant For: History   Social History  . Marital Status: Married    Spouse Name: N/A    Number of Children: N/A  . Years of Education: N/A   Social History Main Topics  . Smoking status: Former Smoker    Quit date: 07/26/1969  . Smokeless tobacco: None  . Alcohol Use: Yes     Comment: wine, occasionally  . Drug Use: No  . Sexual Activity: None   Other Topics Concern  . 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 He has been married for 55 years and has 5 children. He drinks about 1 cup of caffeine daily and 1 glass of wine per day. He has never used illicit drugs.  Quit tonacco in 1971.    His Allergies Are:  Allergies  Allergen Reactions  . Codeine Hives and Rash    *CODEINE-GUAIFENESIN*     *COUGH/COLD/ALLERGY*  . Guaifenesin & Derivatives Rash    *CODEINE-GUAIFENESIN*   *COUGH/COLD/ALLERGY*  :   His Current Medications Are:  Outpatient Encounter Prescriptions as of 10/25/2013  Medication Sig  . Ascorbic Acid (VITAMIN C) 1000 MG tablet Take 1,000 mg by mouth daily.  Marland Kitchen aspirin 81 MG tablet Take 81 mg by mouth daily.  . carbidopa-levodopa  (SINEMET IR) 25-100 MG per tablet Take 2 tablets by mouth 3 (three) times daily. At 7, 11, 4 PM  . carvedilol (COREG) 3.125 MG tablet Take 2 tablets by mouth 3 (three) times daily.  . cholecalciferol (VITAMIN D) 1000 UNITS tablet Take 1,000 Units by mouth daily.  Marland Kitchen CINNAMON PO Take 1000 mg twice daily  . NEXIUM 40 MG capsule Take 40 mg by mouth daily.   . pravastatin (PRAVACHOL) 40 MG tablet Take 40 mg by mouth daily.   . rasagiline (AZILECT) 1 MG TABS tablet Take 1 tablet (1 mg total) by mouth daily.  . vitamin B-12 (CYANOCOBALAMIN) 500  MCG tablet Take 1,000 mcg by mouth daily.  Marland Kitchen amoxicillin (AMOXIL) 500 MG tablet Take 500 mg by mouth as directed. Take 4 tablets by mouth 1 hour before dental procedure  . [DISCONTINUED] amLODipine (NORVASC) 5 MG tablet Take 5 mg by mouth daily.  :  Review of Systems:  Out of a complete 14 point review of systems, all are reviewed and negative with the exception of these symptoms as listed below:   Review of Systems  Constitutional: Negative.   HENT: Negative.   Eyes: Negative.   Respiratory: Negative.   Cardiovascular:       Murmur  Gastrointestinal: Negative.   Endocrine: Negative.   Genitourinary: Negative.   Musculoskeletal: Negative.   Skin: Negative.   Allergic/Immunologic: Negative.   Neurological: Positive for tremors.  Hematological: Negative.   Psychiatric/Behavioral: Positive for sleep disturbance (frequent waking, eds). The patient is nervous/anxious.     Objective:  Neurologic Exam  Physical Exam Physical Examination:   Filed Vitals:   10/25/13 1155  BP: 152/70  Pulse: 68  Temp: 97.9 F (36.6 C)   General Examination: The patient is a very pleasant 80 y.o. male in no acute distress.  HEENT: Normocephalic, atraumatic, pupils are equal, round and reactive to light and accommodation. Funduscopic exam is normal with sharp disc margins noted. Extraocular tracking shows mild saccadic breakdown without nystagmus noted. There is  limitation to upper gaze. There is mild decrease in eye blink rate. Hearing is intact. Face is symmetric with mild facial masking and normal facial sensation. There is a mild to at times moderate intermittent lower jaw tremor. Neck is mildly rigid with intact passive ROM. There are no carotid bruits on auscultation. Oropharynx exam reveals mild mouth dryness. No significant airway crowding is noted. Mallampati is class II. Tongue protrudes centrally and palate elevates symmetrically.   There is no drooling.   Chest: is clear to auscultation without wheezing, rhonchi or crackles noted.  Heart: sounds are regular and normal without murmurs, rubs or gallops noted.   Abdomen: is soft, non-tender and non-distended with normal bowel sounds appreciated on auscultation.  Extremities: There is no pitting edema in the distal lower extremities bilaterally. Pedal pulses are intact. There are no varicose veins.  Skin: is warm and dry with no trophic changes noted. Age-related changes are noted on the skin.   Musculoskeletal: exam reveals no obvious joint deformities, tenderness, joint swelling or erythema. Unremarkable scars on both knees from TKAs.   Neurologically:  Mental status: The patient is awake and alert, paying good  attention. He is able to completely provide the history. He is oriented to: person, place, time/date, situation, day of week, month of year and year. His memory, attention, language and knowledge are intact. There is no aphasia, agnosia, apraxia or anomia. There is a mild degree of bradyphrenia. Speech is moderately hypophonic with no dysarthria noted. Mood is congruent and affect is normal.    Cranial nerves are as described above under HEENT exam. In addition, shoulder shrug is normal with equal shoulder height noted.  Motor exam: Normal bulk, and strength for age is noted. There are mild generalized dyskinesias noted.   Tone is mildly rigid with presence of cogwheeling in the upper  extremities. There is overall mild bradykinesia. There is no drift or rebound.  There is a mild resting tremor in the upper extremities and a minimal intermittent tremor in the right lower extremity.  Romberg is negative.  Reflexes are 1+ in the upper extremities, trace in  the knees and absent in the ankles.    Fine motor skills exam: Finger taps are moderately impaired on the right and mildly impaired on the left. Hand movements are mildly impaired on the right and mildly impaired on the left. RAP (rapid alternating patting) is mildly impaired on the right and mildly impaired on the left. Foot taps are moderately impaired on the right and moderately impaired on the left. Foot agility (in the form of heel stomping) is mildly impaired on the right and minimally impaired on the left.    Cerebellar testing shows no dysmetria or intention tremor on finger to nose testing. There is no truncal or gait ataxia.   Sensory exam is intact to light touch, pinprick, vibration, temperature sense in the upper and lower extremities.   Gait, station and balance: He stands up from the seated position with mild difficulty and does not need to push up with His hands. He needs no assistance. No veering to one side is noted. He is not noted to lean to the side. Posture is mildly stooped, appropriate for age in fact. Stance is narrow-based. He walks with decrease in stride length and pace and decreased arm swing on both sides. He turns in en bloc. Balance is not significantly impaired.      Assessment and Plan:   In summary, [name] is a very pleasant 79 year old gentleman with a history of Parkinson's disease, right-sided predominant of over 25 years' duration. His physical exam is stable, maybe a little better than last time and the increase in the C/L has helped. He has had some recent stressors. He lost a nephew to cancer last week home he was really close with. He has an appointment tomorrow with Dr. Rayann Heman in  electrophysiology. From the PD standpoint, he is doing fairly well at this time and I reassured the patient in that regard. If he does need a pacemaker in the near future, I told him that there is no contraindication from the neurological standpoint. Nevertheless, Parkinson's patients sometimes have a longer recuperation time and sometimes increase in their PD symptoms. Operatively and postoperatively. I again discussed with the patient his diagnosis, medical and nonpharmacological treatments. I encouraged the patient to eat healthy, exercise daily and keep well hydrated, to keep a scheduled bedtime and wake time routine, to not skip any meals and eat healthy snacks in between meals and to have protein with every meal. In particular, I stressed the importance of regular exercise, within of course the patient's own mobility limitations. I suggested that he continue taking Sinemet 2 pills 3 times a day and azilect once daily. He was not able to tolerate a later dose of Sinemet because of insomnia. In the past she had side effects with some other PD medications and does seem to be sensitive to medications and medication changes. I would like to see him back in 6 months from now, sooner if the need arises. He is encouraged to call us with any interim questions, concerns, problems or updates and refill requests. He was in agreement.

## 2013-10-26 ENCOUNTER — Encounter: Payer: Self-pay | Admitting: *Deleted

## 2013-10-26 ENCOUNTER — Ambulatory Visit (INDEPENDENT_AMBULATORY_CARE_PROVIDER_SITE_OTHER): Payer: Medicare Other | Admitting: Internal Medicine

## 2013-10-26 ENCOUNTER — Encounter: Payer: Self-pay | Admitting: Internal Medicine

## 2013-10-26 VITALS — BP 164/82 | HR 60 | Ht 66.0 in | Wt 156.0 lb

## 2013-10-26 DIAGNOSIS — Z79899 Other long term (current) drug therapy: Secondary | ICD-10-CM

## 2013-10-26 DIAGNOSIS — G20 Parkinson's disease: Secondary | ICD-10-CM

## 2013-10-26 DIAGNOSIS — I4589 Other specified conduction disorders: Secondary | ICD-10-CM

## 2013-10-26 DIAGNOSIS — I495 Sick sinus syndrome: Secondary | ICD-10-CM

## 2013-10-26 DIAGNOSIS — Z01812 Encounter for preprocedural laboratory examination: Secondary | ICD-10-CM

## 2013-10-26 DIAGNOSIS — I10 Essential (primary) hypertension: Secondary | ICD-10-CM

## 2013-10-26 LAB — T4: T4, Total: 7.1 ug/dL (ref 5.0–12.5)

## 2013-10-26 NOTE — Patient Instructions (Signed)
Your physician has recommended that you have a pacemaker inserted. A pacemaker is a small device that is placed under the skin of your chest or abdomen to help control abnormal heart rhythms. This device uses electrical pulses to prompt the heart to beat at a normal rate. Pacemakers are used to treat heart rhythms that are too slow. Wire (leads) are attached to the pacemaker that goes into the chambers of you heart. This is done in the hospital and usually requires and overnight stay. Please see the instruction sheet given to you today for more information.  Pacemaker Implantation The heart has its own electrical system, or natural pacemaker, to regulate the heart beat. Sometimes, the natural pacemaker system of the heart fails and causes the heart to beat too slowly. If this happens, a pacemaker can be surgically placed to help the heart beat at a normal or programmed rate. A pacemaker is a small, battery powered device that is placed under the skin. The pacemaker is programmed to sense and "fire" when the heart rate falls below a certain heart rate. When the pacemaker triggers a heart beat, it is called "capture." Parts of a pacemaker include:  Wires or leads. The leads are placed in the heart and transmit electricity to the heart. The leads are connected to the pulse generator.  Pulse generator. The pulse generator contains a computer and a memory system. The pulse generator also produces the electrical signal that triggers the heart to beat. A PACEMAKER MAY BE PLACED IF:  You have a slow heart beat (bradycardia).  You have fainting (syncope).  Shortness of breath (dyspnea) due to heart problems. LET YOUR CAREGIVER KNOW ABOUT:  Allergies to all medicine, food, latex, tape, contrast dye or iodine.  All medicines taken including:  Prescription medicines.  Over-the-counter medicines.  Steroid medicine, including injections or creams applied to the skin.  Eyedrops.  Vitamins or  herbs.  Health conditions such as heart, kidney, respiratory and diabetes problems.  History of bleeding problems or blood clots.  Problems with general anesthesia.  Previous surgery.  Possibility of being pregnant. RISKS AND COMPLICATIONS The risks, benefits and other treatment options will be discussed before the procedure. Complications are rare but can include:  Bleeding.  Unable to place the pacemaker under local sedation.  Infection. BEFORE THE PROCEDURE  You will have blood work drawn before the procedure.  If you smoke, quit.  Do not eat or drink anything for 6 hours before the surgery or as told by your caregiver.  Ask if it is okay to take medicines with a sip of water in the morning of your procedure.  If you are diabetic, follow your caregiver's recommendation on taking insulin.  On average, it takes about an hour to place a pacemaker. PROCEDURE  The surgery to place a pacemaker is considered a minimally invasive surgical procedure. It is done under a local anesthetic, which is an injection at the incision site that makes the skin numb. You are also given sedation and pain medicine that makes you drowsy and forgetful during the procedure.   An intravenous line (IV) will be started in your hand or arm so sedation and pain medicine can be given during the pacemaker procedure.  A numbing medicine will be injected into the skin where the pacemaker will be placed. A small incision is then made into the skin. The pacemaker is usually placed under the skin near the collarbone.  After the incision is made, the leads are inserted into  a large vein and are guided into the heart using X-ray.  Using the same incision to place the leads, a small pocket is created under the skin to hold the pulse generator. The leads are then connected to the pulse generator.  The incision site is then closed. A dressing is placed over the pacemaker site. The dressing is removed 24 to 48  hours afterwards. AFTER THE PROCEDURE  You will be taken to a recovery area after the pacemaker implant. Your vital signs such as blood pressure, heart rate, breathing and oxygen levels will be monitored.  A chest X-ray is done after the pacemaker is implanted. This is to make sure the pacemaker and leads are in the correct place.  Most but not all people stay overnight in the hospital and go home the next day after a pacemaker implant. Document Released: 03/27/2002 Document Revised: 10/06/2011 Document Reviewed: 08/11/2011 [facility] Patient Information 2015 [place], [place]. This information is not intended to replace advice given to you by your health care provider. Make sure you discuss any questions you have with your health care provider.

## 2013-10-26 NOTE — Progress Notes (Signed)
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  He reports having a tremor associated with parkinsons that is quite difficult to tolerate at times.  This tremor has improved with coreg.  He has sinus bradycardia as well as bifascicular block.  He has frequent fatigue, decreased exercise tolerance, and dizziness with this.  He stopped coreg but found that his tremor became much much worse.  In addition, his bradycardia and associated symptoms did not improve.  He has therefore restarted his coreg. He recently had a stress test performed by Dr Einar Gip which revealed chronotropic intolerance.  Today, he denies symptoms of palpitations, chest pain, shortness of breath, orthopnea, PND, lower extremity edema, presyncope, syncope, or neurologic sequela. The patient is tolerating medications without difficulties and is otherwise without complaint today.   Past Medical History  Diagnosis Date  . 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 Parkinson's disease 03/20/1996  . Bilateral inguinal hernia (BIH) s/p lap BIH repair 08/12/2012 07/26/2012  . Trifascicular block   . Essential hypertension, benign   . Malaise and fatigue   . Hollenhorst plaque, left eye   . Hyperlipidemia   . Coarse tremors     Benign essential tremors.  . Aortic valve regurgitation   . URI (upper respiratory infection) 05/2013    lasted for most of 2 - 2 1/2 months  . Chronotropic incompetence    Past Surgical History  Procedure Laterality Date  . Cataract eye surgery  08/25/1999  . Total knee arthroplasty  02/24/2002    right knee  . Replacement total knee  02/20/2003  . Revision total knee arthroplasty  08/08/2004    right knee infection  . Revision total knee arthroplasty  09/18/2004    right knee completed  . Brow lift and eyelids  03/06/2008    both eyes   . Laparoscopic inguinal hernia repair Bilateral 08/12/2012  . Hernia repair Bilateral 08/12/12    Current Outpatient Prescriptions  Medication Sig Dispense Refill  . amoxicillin (AMOXIL) 500 MG capsule Take 2,000 mg by mouth once as needed.      Marland Kitchen aspirin EC 81 MG tablet Take 81 mg by mouth daily.      . carbidopa-levodopa (SINEMET IR) 25-100 MG per tablet Take 2 tablets by mouth 3 (three) times daily.      . carvedilol (COREG) 3.125 MG tablet Take 3.125 mg by mouth 2 (two) times daily with a meal.      . cholecalciferol (VITAMIN D) 1000 UNITS tablet Take 1,000 Units by mouth daily.      Marland Kitchen CINNAMON PO Take 1,000 mg by mouth 2 (two) times daily.      Marland Kitchen esomeprazole (NEXIUM) 40 MG capsule Take 40 mg by mouth daily at 12 noon.      . pravastatin (PRAVACHOL) 40 MG tablet Take 40 mg by mouth every evening.      . rasagiline (AZILECT) 1 MG TABS tablet Take 1 mg by mouth daily.      . vitamin B-12 (CYANOCOBALAMIN) 1000 MCG tablet Take 1,000 mcg by mouth daily.      . vitamin C (ASCORBIC ACID) 500 MG tablet Take 500 mg by mouth 2 (two) times daily.       No current facility-administered medications for this visit.    Allergies  Allergen Reactions  .  Codeine Hives and Rash    *CODEINE-GUAIFENESIN*     *COUGH/COLD/ALLERGY*  . Guaifenesin & Derivatives Rash    *CODEINE-GUAIFENESIN*   *COUGH/COLD/ALLERGY*    History   Social History  . Marital Status: Married    Spouse Name: N/A    Number of Children: N/A  . Years of Education: N/A   Occupational History  . Not on file.   Social History Main Topics  . Smoking status: Former Smoker    Quit date: 07/26/1969  . Smokeless tobacco: Not on file  . Alcohol Use: Yes     Comment: wine, occasionally  . Drug Use: No  . Sexual Activity: Not on file   Other Topics Concern  . Not on file   Social History Narrative   Right handed, 80 year old retiree.  He previously managed a Civil engineer, contracting.  He has been married for 55 years and has 5  children. He drinks about 1 cup of caffeine daily and 1 glass of wine per day. He has never used illicit drugs.  Quit tonacco in 1971.  Lives with spouse in [place].    Family History  Problem Relation Age of Onset  . Kidney disease Father   . Cancer Father     renal  . COPD Sister   . Cancer Sister 27    Breast  . Congestive Heart Failure Mother     ROS- All systems are reviewed and negative except as per the HPI above  Physical Exam: Filed Vitals:   10/26/13 1526  BP: 164/82  Pulse: 60  Height: 5\' 6"  (1.676 m)  Weight: 156 lb (70.761 kg)    GEN- The patient is elderly appearing, alert and oriented x 3 today.   Head- normocephalic, atraumatic Eyes-  Sclera clear, conjunctiva pink Ears- hearing intact Oropharynx- clear Neck- supple, no JVP Lymph- no cervical lymphadenopathy Lungs- Clear to ausculation bilaterally, normal work of breathing Heart- Regular rate and rhythm, no murmurs, rubs or gallops, PMI not laterally displaced GI- soft, NT, ND, + BS Extremities- no clubbing, cyanosis, or edema MS- no significant deformity or atrophy Skin- no rash or lesion Psych- euthymic mood, full affect Neuro- strength and sensation are intact, resting tremor is noted  EKG today reveals sinus rhythm 60 bpm, with nonconducted pacs, bifascicular block Event monitor 5/15 is reviewed which reveals second degree AV block and frequent sinus bradycardia Over 20 pages of records from Dr Irven Shelling office are also reviewed  Assessment and Plan:  1. Symptomatic sinus bradycardia with documented chronotropic incompetence and fatigue Sick sinus has been documented by event monitor and gxt.  He also has frequent second degree AV block and bifascicular block on ekg.  He did try stopping beta blockers but did not have any improvement of these symptoms/ findings off of beta blocker therapy.  His tremor of parkinsons worsened substantially off of beta blockers and therefore coreg has been  restarted. The patient has symptomatic bradycardia.  I would therefore recommend pacemaker implantation at this time.  Risks, benefits, alternatives to pacemaker implantation were discussed in detail with the patient today. The patient understands that the risks include but are not limited to bleeding, infection, pneumothorax, perforation, tamponade, vascular damage, renal failure, MI, stroke, death,  and lead dislodgement and wishes to proceed. We will therefore schedule the procedure at the next available time. Given symptomatic sick sinus, I think that he may benefit from CLS rate response.  I would therefore plan to implant a Biotronik pacemaker at the time  of implantation.  2. htn Stable No change required today  We will schedule ppm implant at the next available time.

## 2013-10-27 ENCOUNTER — Encounter (HOSPITAL_COMMUNITY): Payer: Self-pay | Admitting: Pharmacy Technician

## 2013-10-27 LAB — BASIC METABOLIC PANEL
BUN: 24 mg/dL — AB (ref 6–23)
CO2: 26 mEq/L (ref 19–32)
Calcium: 9.1 mg/dL (ref 8.4–10.5)
Chloride: 98 mEq/L (ref 96–112)
Creatinine, Ser: 0.9 mg/dL (ref 0.4–1.5)
GFR: 85.3 mL/min (ref >60.00)
Glucose, Bld: 103 mg/dL — ABNORMAL HIGH (ref 70–99)
Potassium: 4.3 mEq/L (ref 3.5–5.1)
Sodium: 135 mEq/L (ref 135–145)

## 2013-10-27 LAB — CBC WITH DIFFERENTIAL/PLATELET
BASOS PCT: 0.2 % (ref 0.0–3.0)
Basophils Absolute: 0 10*3/uL (ref 0.0–0.1)
EOS ABS: 0.1 10*3/uL (ref 0.0–0.7)
Eosinophils Relative: 0.9 % (ref 0.0–5.0)
HCT: 41.9 % (ref 39.0–52.0)
HEMOGLOBIN: 14.3 g/dL (ref 13.0–17.0)
Lymphocytes Relative: 29.8 % (ref 12.0–46.0)
Lymphs Abs: 2.7 10*3/uL (ref 0.7–4.0)
MCHC: 34 g/dL (ref 30.0–36.0)
MCV: 97.7 fl (ref 78.0–100.0)
MONO ABS: 0.6 10*3/uL (ref 0.1–1.0)
Monocytes Relative: 6.7 % (ref 3.0–12.0)
NEUTROS ABS: 5.6 10*3/uL (ref 1.4–7.7)
Neutrophils Relative %: 62.4 % (ref 43.0–77.0)
Platelets: 199 10*3/uL (ref 150.0–400.0)
RBC: 4.29 Mil/uL (ref 4.22–5.81)
RDW: 14.6 % (ref 11.5–15.5)
WBC: 9 10*3/uL (ref 4.0–10.5)

## 2013-10-27 LAB — PROTIME-INR
INR: 0.9 ratio (ref 0.8–1.0)
Prothrombin Time: 10.3 s (ref 9.6–13.1)

## 2013-10-27 LAB — TSH: TSH: 1.89 u[IU]/mL (ref 0.35–4.50)

## 2013-10-29 DIAGNOSIS — I495 Sick sinus syndrome: Secondary | ICD-10-CM | POA: Insufficient documentation

## 2013-10-29 DIAGNOSIS — I10 Essential (primary) hypertension: Secondary | ICD-10-CM | POA: Insufficient documentation

## 2013-10-29 DIAGNOSIS — I4589 Other specified conduction disorders: Secondary | ICD-10-CM | POA: Insufficient documentation

## 2013-11-03 ENCOUNTER — Encounter (HOSPITAL_COMMUNITY)
Admission: RE | Disposition: A | Discharge status: Home / Self Care | Payer: Self-pay | Source: Ambulatory Visit | Attending: Internal Medicine

## 2013-11-03 ENCOUNTER — Encounter (HOSPITAL_COMMUNITY): Payer: Self-pay | Admitting: *Deleted

## 2013-11-03 ENCOUNTER — Ambulatory Visit (HOSPITAL_COMMUNITY)
Admission: RE | Admit: 2013-11-03 | Discharge: 2013-11-04 | Disposition: A | Discharge status: Home / Self Care | Payer: Medicare Other | Source: Ambulatory Visit | Attending: Internal Medicine | Admitting: Internal Medicine

## 2013-11-03 DIAGNOSIS — G20 Parkinson's disease: Secondary | ICD-10-CM | POA: Insufficient documentation

## 2013-11-03 DIAGNOSIS — Z79899 Other long term (current) drug therapy: Secondary | ICD-10-CM | POA: Insufficient documentation

## 2013-11-03 DIAGNOSIS — G20A1 Parkinson's disease without dyskinesia, without mention of fluctuations: Secondary | ICD-10-CM | POA: Insufficient documentation

## 2013-11-03 DIAGNOSIS — I441 Atrioventricular block, second degree: Secondary | ICD-10-CM | POA: Diagnosis not present

## 2013-11-03 DIAGNOSIS — I359 Nonrheumatic aortic valve disorder, unspecified: Secondary | ICD-10-CM | POA: Diagnosis not present

## 2013-11-03 DIAGNOSIS — I4589 Other specified conduction disorders: Secondary | ICD-10-CM | POA: Diagnosis present

## 2013-11-03 DIAGNOSIS — Z7982 Long term (current) use of aspirin: Secondary | ICD-10-CM | POA: Insufficient documentation

## 2013-11-03 DIAGNOSIS — I495 Sick sinus syndrome: Secondary | ICD-10-CM | POA: Insufficient documentation

## 2013-11-03 DIAGNOSIS — I10 Essential (primary) hypertension: Secondary | ICD-10-CM | POA: Diagnosis not present

## 2013-11-03 DIAGNOSIS — E785 Hyperlipidemia, unspecified: Secondary | ICD-10-CM | POA: Diagnosis not present

## 2013-11-03 HISTORY — PX: PACEMAKER INSERTION: SHX728

## 2013-11-03 HISTORY — PX: PERMANENT PACEMAKER INSERTION: SHX5480

## 2013-11-03 LAB — SURGICAL PCR SCREEN
MRSA, PCR: NEGATIVE
Staphylococcus aureus: NEGATIVE

## 2013-11-03 SURGERY — PERMANENT PACEMAKER INSERTION
Anesthesia: LOCAL

## 2013-11-03 MED ORDER — CEFAZOLIN SODIUM-DEXTROSE 2-3 GM-% IV SOLR
INTRAVENOUS | Status: AC
  Filled 2013-11-03: qty 50

## 2013-11-03 MED ORDER — CARVEDILOL 3.125 MG PO TABS
3.1250 mg | ORAL_TABLET | Freq: Two times a day (BID) | ORAL | Status: DC
  Administered 2013-11-03 – 2013-11-04 (×2): 3.125 mg via ORAL
  Filled 2013-11-03 (×4): qty 1

## 2013-11-03 MED ORDER — FENTANYL CITRATE 0.05 MG/ML IJ SOLN
INTRAMUSCULAR | Status: AC
  Filled 2013-11-03: qty 2

## 2013-11-03 MED ORDER — CHLORHEXIDINE GLUCONATE 4 % EX LIQD
60.0000 mL | Freq: Once | CUTANEOUS | Status: DC
  Filled 2013-11-03: qty 60

## 2013-11-03 MED ORDER — ACETAMINOPHEN 325 MG PO TABS
325.0000 mg | ORAL_TABLET | ORAL | Status: DC | PRN
  Administered 2013-11-03 (×2): 650 mg via ORAL
  Filled 2013-11-03 (×2): qty 2

## 2013-11-03 MED ORDER — SODIUM CHLORIDE 0.9 % IV SOLN: 250.0000 mL | INTRAVENOUS | Status: DC | PRN

## 2013-11-03 MED ORDER — RASAGILINE MESYLATE 1 MG PO TABS
1.0000 mg | ORAL_TABLET | Freq: Every day | ORAL | Status: DC
  Administered 2013-11-04: 1 mg via ORAL
  Filled 2013-11-03 (×2): qty 1

## 2013-11-03 MED ORDER — SODIUM CHLORIDE 0.9 % IJ SOLN
3.0000 mL | Freq: Two times a day (BID) | INTRAMUSCULAR | Status: DC
  Administered 2013-11-03 (×2): 3 mL via INTRAVENOUS

## 2013-11-03 MED ORDER — VITAMIN B-12 1000 MCG PO TABS
1000.0000 ug | ORAL_TABLET | Freq: Every day | ORAL | Status: DC
  Administered 2013-11-03 – 2013-11-04 (×2): 1000 ug via ORAL
  Filled 2013-11-03 (×2): qty 1

## 2013-11-03 MED ORDER — HEPARIN (PORCINE) IN NACL 2-0.9 UNIT/ML-% IJ SOLN
INTRAMUSCULAR | Status: AC
  Filled 2013-11-03: qty 500

## 2013-11-03 MED ORDER — SODIUM CHLORIDE 0.9 % IR SOLN
80.0000 mg | Status: DC
  Filled 2013-11-03: qty 2

## 2013-11-03 MED ORDER — CARBIDOPA-LEVODOPA 25-100 MG PO TABS
2.0000 | ORAL_TABLET | Freq: Three times a day (TID) | ORAL | Status: DC
  Administered 2013-11-03 – 2013-11-04 (×3): 2 via ORAL
  Filled 2013-11-03 (×6): qty 2

## 2013-11-03 MED ORDER — CEFAZOLIN SODIUM 1-5 GM-% IV SOLN
1.0000 g | Freq: Four times a day (QID) | INTRAVENOUS | Status: AC
  Administered 2013-11-03 – 2013-11-04 (×3): 1 g via INTRAVENOUS
  Filled 2013-11-03 (×4): qty 50

## 2013-11-03 MED ORDER — LIDOCAINE HCL (PF) 1 % IJ SOLN
INTRAMUSCULAR | Status: AC
  Filled 2013-11-03: qty 60

## 2013-11-03 MED ORDER — PANTOPRAZOLE SODIUM 40 MG PO TBEC
40.0000 mg | DELAYED_RELEASE_TABLET | Freq: Every day | ORAL | Status: DC
  Administered 2013-11-03 – 2013-11-04 (×2): 40 mg via ORAL
  Filled 2013-11-03 (×2): qty 1

## 2013-11-03 MED ORDER — MUPIROCIN 2 % EX OINT
TOPICAL_OINTMENT | Freq: Two times a day (BID) | CUTANEOUS | Status: DC
Start: 2013-11-03 — End: 2013-11-03
  Administered 2013-11-03: 1 via NASAL
  Filled 2013-11-03: qty 22

## 2013-11-03 MED ORDER — SODIUM CHLORIDE 0.9 % IV SOLN
INTRAVENOUS | Status: DC
  Administered 2013-11-03: 11:00:00 via INTRAVENOUS

## 2013-11-03 MED ORDER — CEFAZOLIN SODIUM-DEXTROSE 2-3 GM-% IV SOLR: 2.0000 g | INTRAVENOUS | Status: DC

## 2013-11-03 MED ORDER — MIDAZOLAM HCL 5 MG/5ML IJ SOLN
INTRAMUSCULAR | Status: AC
  Filled 2013-11-03: qty 5

## 2013-11-03 MED ORDER — MUPIROCIN 2 % EX OINT
TOPICAL_OINTMENT | CUTANEOUS | Status: AC
  Filled 2013-11-03: qty 22

## 2013-11-03 MED ORDER — LIDOCAINE HCL (PF) 1 % IJ SOLN
INTRAMUSCULAR | Status: AC
  Filled 2013-11-03: qty 30

## 2013-11-03 MED ORDER — SODIUM CHLORIDE 0.9 % IJ SOLN: 3.0000 mL | INTRAMUSCULAR | Status: DC | PRN

## 2013-11-03 MED ORDER — ONDANSETRON HCL 4 MG/2ML IJ SOLN: 4.0000 mg | Freq: Four times a day (QID) | INTRAMUSCULAR | Status: DC | PRN

## 2013-11-03 NOTE — Discharge Summary (Signed)
ELECTROPHYSIOLOGY PROCEDURE DISCHARGE SUMMARY    Patient ID: [name],  MRN: [id number], DOB/AGE: 1927-03-07 80 y.o.  Admit date: 11/03/2013 Discharge date: 11/03/2013  Primary Care Physician: Jani Gravel, MD Primary Cardiologist: Einar Gip Electrophysiologist: Allred  Primary Discharge Diagnosis:  Symptomatic sick sinus syndrome and 2nd degree AV block s/p pacemaker implantation this admission  Secondary Discharge Diagnosis:  1.  Parkinsons 2.  Hypertension 3.  Chronotropic incompetence   Allergies  Allergen Reactions  . Codeine Hives and Rash    *CODEINE-GUAIFENESIN*     *COUGH/COLD/ALLERGY*  . Guaifenesin & Derivatives Rash    *CODEINE-GUAIFENESIN*   *COUGH/COLD/ALLERGY*     Procedures This Admission: 1.  Implantation of a dual chamber pacemaker on 11-03-13 by Dr Rayann Heman.  The patient received a Biotronik [place] 8DR pacemaker with model number Setrox S53 right atrial lead and model number S60 rigth ventricular lead.  There were no early apparent complications.  2.  CXR on 11-04-13 demonstrated no PTX and stable lead position  Brief HPI: [name] is a 80 y.o. male with a past medical history as outlined above.  He was referred to EP in the outpatient setting for consideration of pacemaker implantation.  Due to symptomatic bradycardia and 2nd degree AV block, pacemaker implantation was recommended.  Risks, benefits, and alternatives were reviewed with the patient who wished to proceed.   Hospital Course:  The patient was admitted and underwent implantation of a Biotronik dual chamber pacemaker with details as outlined above.   He was monitored on telemetry overnight which demonstrated atrial pacing.  Left chest was without hematoma or ecchymosis.  The device was interrogated and found to be functioning normally.  CXR was obtained and demonstrated no pneumothorax status post device implantation.  Wound care, arm mobility, and restrictions were reviewed with the  patient.  Dr Lovena Le examined the patient and considered them stable for discharge to home.    Discharge Vitals: Blood pressure 127/72, pulse 61, temperature 97.9 F (36.6 C), temperature source Oral, resp. rate 20, height 5\' 6"  (1.676 m), weight 150 lb (68.04 kg), SpO2 100.00%.   Labs:   Lab Results  Component Value Date   WBC 9.0 10/26/2013   HGB 14.3 10/26/2013   HCT 41.9 10/26/2013   MCV 97.7 10/26/2013   PLT 199.0 10/26/2013   No results found for this basename: NA, K, CL, CO2, BUN, CREATININE, CALCIUM, LABALBU, PROT, BILITOT, ALKPHOS, ALT, AST, GLUCOSE,  in the last 168 hours   Discharge Medications:    Medication List    ASK your doctor about these medications       amoxicillin 500 MG capsule  Commonly known as:  AMOXIL  Take 2,000 mg by mouth once as needed.     aspirin EC 81 MG tablet  Take 81 mg by mouth daily.     AZILECT 1 MG Tabs tablet  Generic drug:  rasagiline  Take 1 mg by mouth daily.     carbidopa-levodopa 25-100 MG per tablet  Commonly known as:  SINEMET IR  Take 2 tablets by mouth 3 (three) times daily.     carvedilol 3.125 MG tablet  Commonly known as:  COREG  Take 3.125 mg by mouth 2 (two) times daily with a meal.     cholecalciferol 1000 UNITS tablet  Commonly known as:  VITAMIN D  Take 1,000 Units by mouth daily.     CINNAMON PO  Take 1,000 mg by mouth 2 (two) times daily.  esomeprazole 40 MG capsule  Commonly known as:  NEXIUM  Take 40 mg by mouth daily at 12 noon.     pravastatin 40 MG tablet  Commonly known as:  PRAVACHOL  Take 40 mg by mouth every evening.     vitamin B-12 1000 MCG tablet  Commonly known as:  CYANOCOBALAMIN  Take 1,000 mcg by mouth daily.     vitamin C 500 MG tablet  Commonly known as:  ASCORBIC ACID  Take 500 mg by mouth 2 (two) times daily.        Disposition:     Duration of Discharge Encounter: less than 30 minutes including physician time.  Signed, Mikle Bosworth.D.

## 2013-11-03 NOTE — Progress Notes (Signed)
UR Completed Dontreal Miera Graves-Bigelow, RN,BSN [phone]  

## 2013-11-03 NOTE — H&P (View-Only) (Signed)
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  He reports having a tremor associated with parkinsons that is quite difficult to tolerate at times.  This tremor has improved with coreg.  He has sinus bradycardia as well as bifascicular block.  He has frequent fatigue, decreased exercise tolerance, and dizziness with this.  He stopped coreg but found that his tremor became much much worse.  In addition, his bradycardia and associated symptoms did not improve.  He has therefore restarted his coreg. He recently had a stress test performed by Dr Einar Gip which revealed chronotropic intolerance.  Today, he denies symptoms of palpitations, chest pain, shortness of breath, orthopnea, PND, lower extremity edema, presyncope, syncope, or neurologic sequela. The patient is tolerating medications without difficulties and is otherwise without complaint today.   Past Medical History  Diagnosis Date  . 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 Parkinson's disease 03/20/1996  . Bilateral inguinal hernia (BIH) s/p lap BIH repair 08/12/2012 07/26/2012  . Trifascicular block   . Essential hypertension, benign   . Malaise and fatigue   . Hollenhorst plaque, left eye   . Hyperlipidemia   . Coarse tremors     Benign essential tremors.  . Aortic valve regurgitation   . URI (upper respiratory infection) 05/2013    lasted for most of 2 - 2 1/2 months  . Chronotropic incompetence    Past Surgical History  Procedure Laterality Date  . Cataract eye surgery  08/25/1999  . Total knee arthroplasty  02/24/2002    right knee  . Replacement total knee  02/20/2003  . Revision total knee arthroplasty  08/08/2004    right knee infection  . Revision total knee arthroplasty  09/18/2004    right knee completed  . Brow lift and eyelids  03/06/2008    both eyes   . Laparoscopic inguinal hernia repair Bilateral 08/12/2012  . Hernia repair Bilateral 08/12/12    Current Outpatient Prescriptions  Medication Sig Dispense Refill  . amoxicillin (AMOXIL) 500 MG capsule Take 2,000 mg by mouth once as needed.      Marland Kitchen aspirin EC 81 MG tablet Take 81 mg by mouth daily.      . carbidopa-levodopa (SINEMET IR) 25-100 MG per tablet Take 2 tablets by mouth 3 (three) times daily.      . carvedilol (COREG) 3.125 MG tablet Take 3.125 mg by mouth 2 (two) times daily with a meal.      . cholecalciferol (VITAMIN D) 1000 UNITS tablet Take 1,000 Units by mouth daily.      Marland Kitchen CINNAMON PO Take 1,000 mg by mouth 2 (two) times daily.      Marland Kitchen esomeprazole (NEXIUM) 40 MG capsule Take 40 mg by mouth daily at 12 noon.      . pravastatin (PRAVACHOL) 40 MG tablet Take 40 mg by mouth every evening.      . rasagiline (AZILECT) 1 MG TABS tablet Take 1 mg by mouth daily.      . vitamin B-12 (CYANOCOBALAMIN) 1000 MCG tablet Take 1,000 mcg by mouth daily.      . vitamin C (ASCORBIC ACID) 500 MG tablet Take 500 mg by mouth 2 (two) times daily.       No current facility-administered medications for this visit.    Allergies  Allergen Reactions  .  Codeine Hives and Rash    *CODEINE-GUAIFENESIN*     *COUGH/COLD/ALLERGY*  . Guaifenesin & Derivatives Rash    *CODEINE-GUAIFENESIN*   *COUGH/COLD/ALLERGY*    History   Social History  . Marital Status: Married    Spouse Name: N/A    Number of Children: N/A  . Years of Education: N/A   Occupational History  . Not on file.   Social History Main Topics  . Smoking status: Former Smoker    Quit date: 07/26/1969  . Smokeless tobacco: Not on file  . Alcohol Use: Yes     Comment: wine, occasionally  . Drug Use: No  . Sexual Activity: Not on file   Other Topics Concern  . Not on file   Social History Narrative   Right handed, 80 year old retiree.  He previously managed a Civil engineer, contracting.  He has been married for 55 years and has 5  children. He drinks about 1 cup of caffeine daily and 1 glass of wine per day. He has never used illicit drugs.  Quit tonacco in 1971.  Lives with spouse in [place].    Family History  Problem Relation Age of Onset  . Kidney disease Father   . Cancer Father     renal  . COPD Sister   . Cancer Sister 47    Breast  . Congestive Heart Failure Mother     ROS- All systems are reviewed and negative except as per the HPI above  Physical Exam: Filed Vitals:   10/26/13 1526  BP: 164/82  Pulse: 60  Height: 5\' 6"  (1.676 m)  Weight: 156 lb (70.761 kg)    GEN- The patient is elderly appearing, alert and oriented x 3 today.   Head- normocephalic, atraumatic Eyes-  Sclera clear, conjunctiva pink Ears- hearing intact Oropharynx- clear Neck- supple, no JVP Lymph- no cervical lymphadenopathy Lungs- Clear to ausculation bilaterally, normal work of breathing Heart- Regular rate and rhythm, no murmurs, rubs or gallops, PMI not laterally displaced GI- soft, NT, ND, + BS Extremities- no clubbing, cyanosis, or edema MS- no significant deformity or atrophy Skin- no rash or lesion Psych- euthymic mood, full affect Neuro- strength and sensation are intact, resting tremor is noted  EKG today reveals sinus rhythm 60 bpm, with nonconducted pacs, bifascicular block Event monitor 5/15 is reviewed which reveals second degree AV block and frequent sinus bradycardia Over 20 pages of records from Dr Irven Shelling office are also reviewed  Assessment and Plan:  1. Symptomatic sinus bradycardia with documented chronotropic incompetence and fatigue Sick sinus has been documented by event monitor and gxt.  He also has frequent second degree AV block and bifascicular block on ekg.  He did try stopping beta blockers but did not have any improvement of these symptoms/ findings off of beta blocker therapy.  His tremor of parkinsons worsened substantially off of beta blockers and therefore coreg has been  restarted. The patient has symptomatic bradycardia.  I would therefore recommend pacemaker implantation at this time.  Risks, benefits, alternatives to pacemaker implantation were discussed in detail with the patient today. The patient understands that the risks include but are not limited to bleeding, infection, pneumothorax, perforation, tamponade, vascular damage, renal failure, MI, stroke, death,  and lead dislodgement and wishes to proceed. We will therefore schedule the procedure at the next available time. Given symptomatic sick sinus, I think that he may benefit from CLS rate response.  I would therefore plan to implant a Biotronik pacemaker at the time  of implantation.  2. htn Stable No change required today  We will schedule ppm implant at the next available time.

## 2013-11-03 NOTE — Op Note (Signed)
SURGEON: Thompson Grayer, MD  PREPROCEDURE DIAGNOSIS: Symptomatic Sick Sinus Syndrome and Second degree AV block POSTPROCEDURE DIAGNOSIS: Symptomatic Sick Sinus Syndrome and Second degree AV block  PROCEDURES:  1. Pacemaker implantation.   INTRODUCTION: PAVAN BRING is a 80 y.o. male with a history of sick sinus syndrome and symptomatic second degree AV block who presents today for pacemaker implantation. The patient reports intermittent episodes of dizziness and fatigue over many months. He has been documented to have chronotropic incompetence as the cause.  His coreg was stopped and allowed to wash out without improvement.  He decided to then restart coreg due to severe tremors which have been controlled with this medicine.  No reversible causes have been identified. The patient therefore presents today for pacemaker implantation.   DESCRIPTION OF PROCEDURE: Informed written consent was obtained, and the patient was brought to the electrophysiology lab in a fasting state. The patient received IV Versed and Fentanyl as sedation for the procedure today. The patients left chest was prepped and draped in the usual sterile fashion by the EP lab staff. The skin overlying the left deltopectoral region was infiltrated with lidocaine for local analgesia. A 4-cm incision was made over the left deltopectoral region. A left subcutaneous pacemaker pocket was fashioned using a combination of sharp and blunt dissection. Electrocautery was required to assure hemostasis.   RA/RV Lead Placement:  The left axillary vein was cannulated. No contrast was required for the procedure today. Through the left axillary vein, a Biotronic Setrox model S53 (serial number [id number]) right atrial lead and a Biotronic Setrox model S60 (serial number [id number]) right ventricular lead were advanced with fluoroscopic visualization into the right atrial appendage and right ventricular apex positions respectively. Initial atrial lead P-  waves measured 4.2 mV with impedance of 526 ohms and a threshold of 0.6 V at 0.5 msec. Right ventricular lead R-waves measured 5.1 mV with an impedance of 721 ohms and a threshold of 0.8 V at 0.5 msec. Both leads were secured to the pectoralis fascia using #2-0 silk over the suture sleeves.   Device Placement:  The leads were then connected to a Biotronic Etrinsa 8 DR-T (serial number [id number]) pacemaker. The pocket was irrigated with copious gentamicin solution. The pacemaker was then placed into the pocket. The pocket was then closed in 2 layers with 2.0 Vicryl suture for the subcutaneous and subcuticular layers. Steri- Strips and a sterile dressing were then applied. There were no early apparent complications.   CONCLUSIONS:  1. Successful implantation of a Biotronik Evia dual-chamber pacemaker for sick sinus syndrome and second degree AV block 2. No early apparent complications.

## 2013-11-03 NOTE — Interval H&P Note (Signed)
History and Physical Interval Note:  11/03/2013 12:18 PM  [name]  has presented today for surgery, with the diagnosis of hb  The various methods of treatment have been discussed with the patient and family. After consideration of risks, benefits and other options for treatment, the patient has consented to  Procedure(s): PERMANENT PACEMAKER INSERTION (N/A) as a surgical intervention .  The patient's history has been reviewed, patient examined, no change in status, stable for surgery.  I have reviewed the patient's chart and labs.  Questions were answered to the patient's satisfaction.     Thompson Grayer

## 2013-11-04 ENCOUNTER — Ambulatory Visit (HOSPITAL_COMMUNITY): Payer: Medicare Other

## 2013-11-04 DIAGNOSIS — I495 Sick sinus syndrome: Secondary | ICD-10-CM | POA: Diagnosis not present

## 2013-11-04 MED ORDER — YOU HAVE A PACEMAKER BOOK
Freq: Once | Status: AC
  Administered 2013-11-04: 11:00:00
  Filled 2013-11-04: qty 1

## 2013-11-04 NOTE — Discharge Instructions (Signed)

## 2013-11-11 ENCOUNTER — Encounter (HOSPITAL_COMMUNITY): Payer: Self-pay | Admitting: *Deleted

## 2013-11-15 ENCOUNTER — Ambulatory Visit (INDEPENDENT_AMBULATORY_CARE_PROVIDER_SITE_OTHER): Payer: Medicare Other | Admitting: *Deleted

## 2013-11-15 DIAGNOSIS — I495 Sick sinus syndrome: Secondary | ICD-10-CM

## 2013-11-15 DIAGNOSIS — I4589 Other specified conduction disorders: Secondary | ICD-10-CM

## 2013-11-16 LAB — MDC_IDC_ENUM_SESS_TYPE_INCLINIC
Implantable Pulse Generator Model: 394931
Lead Channel Impedance Value: 507 Ohm
Lead Channel Impedance Value: 643 Ohm
Lead Channel Pacing Threshold Amplitude: 0.6 V
Lead Channel Pacing Threshold Pulse Width: 0.4 ms
Lead Channel Sensing Intrinsic Amplitude: 4.8 mV
Lead Channel Sensing Intrinsic Amplitude: 5 mV
Lead Channel Sensing Intrinsic Amplitude: 5.3 mV
Lead Channel Setting Pacing Amplitude: 3 V
Lead Channel Setting Pacing Amplitude: 3 V
Lead Channel Setting Pacing Pulse Width: 0.4 ms
Lead Channel Setting Sensing Sensitivity: 2.5 mV
MDC IDC MSMT LEADCHNL RA PACING THRESHOLD AMPLITUDE: 0.6 V
MDC IDC MSMT LEADCHNL RA PACING THRESHOLD AMPLITUDE: 0.6 V
MDC IDC MSMT LEADCHNL RA PACING THRESHOLD PULSEWIDTH: 0.4 ms
MDC IDC MSMT LEADCHNL RA PACING THRESHOLD PULSEWIDTH: 0.4 ms
MDC IDC MSMT LEADCHNL RA SENSING INTR AMPL: 4.8 mV
MDC IDC MSMT LEADCHNL RV PACING THRESHOLD AMPLITUDE: 0.6 V
MDC IDC MSMT LEADCHNL RV PACING THRESHOLD PULSEWIDTH: 0.4 ms
MDC IDC PG SERIAL: 68280218
MDC IDC SESS DTM: 20150729200400

## 2013-11-16 NOTE — Progress Notes (Signed)
Wound check appointment (industry interrogated device). Steri-strips removed. Wound without redness or edema. Incision edges approximated, wound well healed. Normal device function. Thresholds, sensing, and impedances consistent with implant measurements. Device programmed at 3.0V programmed on for extra safety margin until 3 month visit. Histogram distribution appropriate for patient and level of activity. No mode switches or high ventricular rates noted. Patient educated about wound care, arm mobility, lifting restrictions. ROV in 3 months with JA.

## 2013-12-06 ENCOUNTER — Encounter: Payer: Self-pay | Admitting: Internal Medicine

## 2014-02-05 ENCOUNTER — Encounter: Payer: Medicare Other | Admitting: Internal Medicine

## 2014-03-08 ENCOUNTER — Telehealth: Payer: Self-pay | Admitting: Neurology

## 2014-03-08 NOTE — Telephone Encounter (Signed)
Confirmed appt. Time change with patient for 04/30/14

## 2014-03-29 ENCOUNTER — Encounter (HOSPITAL_COMMUNITY): Payer: Self-pay | Admitting: Internal Medicine

## 2014-04-30 ENCOUNTER — Telehealth: Payer: Self-pay | Admitting: Neurology

## 2014-04-30 ENCOUNTER — Ambulatory Visit (INDEPENDENT_AMBULATORY_CARE_PROVIDER_SITE_OTHER): Payer: Medicare Other | Admitting: Neurology

## 2014-04-30 ENCOUNTER — Encounter: Payer: Self-pay | Admitting: Neurology

## 2014-04-30 VITALS — BP 124/82 | HR 73 | Temp 97.6°F | Ht 65.0 in | Wt 154.0 lb

## 2014-04-30 DIAGNOSIS — G20 Parkinson's disease: Secondary | ICD-10-CM

## 2014-04-30 DIAGNOSIS — G4700 Insomnia, unspecified: Secondary | ICD-10-CM

## 2014-04-30 DIAGNOSIS — F419 Anxiety disorder, unspecified: Secondary | ICD-10-CM

## 2014-04-30 MED ORDER — BUSPIRONE HCL 5 MG PO TABS: 5.0000 mg | ORAL_TABLET | Freq: Three times a day (TID) | ORAL | Status: DC | PRN

## 2014-04-30 MED ORDER — RASAGILINE MESYLATE 1 MG PO TABS: 1.0000 mg | ORAL_TABLET | Freq: Every day | ORAL | Status: DC

## 2014-04-30 NOTE — Telephone Encounter (Signed)
Patient's daughter called and stated appointment card showed appointment time for today was written for 11:45 am not 1:45 pm.  Daughter didn't appreciate how check in made patient feel like it was his mistake.  Wanted to bring this matter to someone's attention.  FYI

## 2014-04-30 NOTE — Patient Instructions (Addendum)
  You can try Melatonin at night for sleep: take 3 to 6 mg, one to 2 hours before your bedtime.    For your anxiety, we will try Buspar (generic: buspirone) 5 mg strength. Take 1 pill up to 3 times a day as needed. Please start with 1 pill at 4 PM daily and you can increase as needed. Side effects may include sedation/sleepiness.    We will keep you on your Parkinson's medications at the same dose.

## 2014-04-30 NOTE — Progress Notes (Signed)
Subjective:    [name] ID: [name] is a 81 y.o. male.  HPI    Interim history:   [name] is a very pleasant 81 year old right-handed gentleman with an underlying medical history of hypertension, hyperlipidemia, and Parkinson's disease, status post inguinal hernia repair bilaterally (08/12/12), cataract surgeries, eyelid surgery, and bilateral TKAs (2003 and '04), who presents for followup consultation of his advanced, right-sided predominant Parkinson's disease of over 25 years duration, complicated by dyskinesias, anxiety, and constipation. [name] is accompanied by his oldest daughter, [name], today. I last saw him on 10/25/2013, at which [name] reported an improved tremor on Johnny increased dose of C/L, which was 2 pills at 7 AM, 11 AM and 4 PM. [name] was driving and reported no recent issues with driving, his mood, his memory or with involuntary movements. [name] reported occasional constipation for which [name] was taking an over-Johnny-counter stool softener as needed and glycerin suppositories as needed. [name] takes an afternoon nap around 4 PM. I kept him on Azilect once daily and Sinemet 2 pills 3 times a day. In Johnny interim, [name] had a cardiac pacemaker placed on 11/03/2013 under Dr. Rayann [name].  Today, [name] reports not sleeping well. [name] particularly has trouble staying asleep. [name] was tried on temazepam by Dr. Einar [name] but had residual sedation on Johnny 15 mg strength and also on 7.5 mg. [name] has tried melatonin at 1.5 mg an hour before bedtime but while it helps him go to sleep [name] often wakes up in Johnny early morning hours and cannot go back to sleep. [name] is suffering from significant anxiety, significant enough to cause him trouble with his sleep and cause him to feel bad. His anxiety is particularly prominent in Johnny afternoon, around 4:05 PM. [name] has a 2 or 3 hour stretch of feeling bad with his anxiety. Thankfully, [name] has not fallen. [name] reports no issues with driving does not feel [name] is depressed. His daughter has not  noticed any additional problems. Motor-wise, [name] feels about Johnny same. [name] does have some dyskinesias.  I saw him on 04/27/2013, at which time I felt she had remained stable. [name] did have a recent set back because of a protracted upper respiratory infection but had been feeling better in that regard. I suggested [name] continue Sinemet 3 times a day because [name] did not tolerate a later dose of Sinemet because of insomnia in Johnny past. I also suggested [name] continue with Azilect. [name] called in June 2015 requesting an increase in his Sinemet to 2 pills 3 times a day. I suggested [name] try this but watch for side effects such as sleepiness, lightheadedness, drop in blood pressure or recurrence of hallucinations.  [name] goes to bed at 10 to 10:30 PM and falls asleep quickly. [name] sees Dr. Einar [name] and has had C.Doppler, echo and has a referral to see Dr. Rayann [name]. [name] may need a PM, [name] stated. [name] fatigues easily. [name] had some stressors lately. [name] lost his nephew last week from lung cancer and [name] was like a son to [name]. [name] is coping ok. Sometimes [name] has difficulty going back to sleep in Johnny early morning hours when [name] gets up to use Johnny bathroom around 3:30 AM.   I first met him on 10/27/2012, at which time I felt [name] was doing fairly well. However, I did increase his Sinemet from 3 times a day to 4 times a day scheduling. [name] was taking 1-1/2 pills at each dose. I suggested that [name] take 1-1/2  pills at 7, 11, 3 PM and 7:30 PM. I kept him on Johnny same dose of Azilect. I also referred him for speech therapy because of his hypophonia.   [name] previously followed with Dr. Morene [name] and was last seen by him on 04/29/2012, at which time Dr. Erling [name] felt that [name] was doing fairly well. [name] had some dyskinesias but had a reaction in Johnny past on amantadine including delirium (admitted to [facility] from 7/2/-10/24/04). Dr. Erling [name] did not change his medications at Johnny time.   [name] has a 25 year history of Parkinson's disease. [name] was on multiple different  medications in Johnny past, including Mirapex, Requip, Comtan, and had side effects. [name] even was hospitalized in Johnny past because of delirium while on amantadine. [name] was on bromocriptine in Johnny distant past. Sinemet was restarted in March 2008 and [name] denied hallucinations, nausea, diarrhea, swallowing or speech difficulties. In January 2014 his MMSE was 30, clock drawing was 4, animal fluency was 22.   His Past Medical History Is Significant For: Past Medical History  Diagnosis Date  . 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 Parkinson's disease 03/20/1996  . Bilateral inguinal hernia (BIH) s/p lap BIH repair 08/12/2012 07/26/2012  . Trifascicular block   . Essential hypertension, benign   . Malaise and fatigue   . Hollenhorst plaque, left eye   . Hyperlipidemia   . Coarse tremors     Benign essential tremors.  . Aortic valve regurgitation   . URI (upper respiratory infection) 05/2013    lasted for most of 2 - 2 1/2 months  . Chronotropic incompetence     His Past Surgical History Is Significant For: Past Surgical History  Procedure Laterality Date  . Cataract eye surgery  08/25/1999  . Total knee arthroplasty  02/24/2002    right knee  . Replacement total knee  02/20/2003  . Revision total knee arthroplasty  08/08/2004    right knee infection  . Revision total knee arthroplasty  09/18/2004    right knee completed  . Brow lift and eyelids  03/06/2008    both eyes  . Laparoscopic inguinal hernia repair Bilateral 08/12/2012  . Hernia repair Bilateral 08/12/12  . Pacemaker insertion  11/03/2013    Biotronik dual chamber pacemaker implanted by Dr [name] for SSS  . Permanent pacemaker insertion N/A 11/03/2013    Procedure: PERMANENT PACEMAKER INSERTION;  Surgeon: Coralyn Mark, MD;  Location: [facility];  Service: Cardiovascular;  Laterality: N/A;    His Family History Is Significant For: Family History  Problem Relation Age of Onset  . Kidney disease Father   . Cancer Father      renal  . COPD Sister   . Cancer Sister 91    Breast  . Congestive Heart Failure Mother     His Social History Is Significant For: History   Social History  . Marital Status: Married    Spouse Name: N/A    Number of Children: N/A  . Years of Education: N/A   Social History Main Topics  . Smoking status: Former Smoker    Quit date: 07/26/1969  . Smokeless tobacco: None  . Alcohol Use: 4.2 oz/week    7 Glasses of wine per week     Comment: wine, occasionally  . Drug Use: No  . Sexual Activity: Yes   Other Topics Concern  . None   Social History Narrative   Right handed, 81 year old retiree.  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  [name] has been married for 55 years and has 5 children. [name] drinks about 1 cup of caffeine daily and 1 glass of wine per day. [name] has never used illicit drugs.  Quit tonacco in 1971.  Lives with spouse in [place].    His Allergies Are:  Allergies  Allergen Reactions  . Codeine Hives and Rash    *CODEINE-GUAIFENESIN*     *COUGH/COLD/ALLERGY*  . Guaifenesin & Derivatives Rash    *CODEINE-GUAIFENESIN*   *COUGH/COLD/ALLERGY*  :   His Current Medications Are:  Outpatient Encounter Prescriptions as of 04/30/2014  Medication Sig  . amoxicillin (AMOXIL) 500 MG capsule Take 2,000 mg by mouth once as needed.  Marland Kitchen aspirin EC 81 MG tablet Take 81 mg by mouth daily.  . carbidopa-levodopa (SINEMET IR) 25-100 MG per tablet Take 2 tablets by mouth 3 (three) times daily.  . carvedilol (COREG) 3.125 MG tablet Take 3.125 mg by mouth once. With meal  . cholecalciferol (VITAMIN D) 1000 UNITS tablet Take 1,000 Units by mouth daily.  Marland Kitchen CINNAMON PO Take 1,000 mg by mouth 2 (two) times daily.  Marland Kitchen esomeprazole (NEXIUM) 40 MG capsule Take 40 mg by mouth daily at 12 noon.  . fludrocortisone (FLORINEF) 0.1 MG tablet   . FLUZONE HIGH-DOSE 0.5 ML SUSY   . pravastatin (PRAVACHOL) 40 MG tablet Take 40 mg by mouth every evening.  . rasagiline (AZILECT) 1 MG TABS tablet  Take 1 mg by mouth daily.  . temazepam (RESTORIL) 7.5 MG capsule   . vitamin B-12 (CYANOCOBALAMIN) 1000 MCG tablet Take 1,000 mcg by mouth daily.  . vitamin C (ASCORBIC ACID) 500 MG tablet Take 500 mg by mouth 2 (two) times daily.  :  Review of Systems:  Out of a complete 14 point review of systems, all are reviewed and negative with Johnny exception of these symptoms as listed below:   Review of Systems  Psychiatric/Behavioral:       Anxiety worse,meds not helping   Not sleeping well at night.   Objective:  Neurologic Exam  Physical Exam Physical Examination:   Filed Vitals:   04/30/14 1151  BP: 124/82  Pulse: 73  Temp: 97.6 F (36.4 C)   General Examination: [name] is a very pleasant 81y.o. male in no acute distress.  HEENT: Normocephalic, atraumatic, pupils are equal, round and reactive to light and accommodation. Funduscopic exam is normal with sharp disc margins noted. Extraocular tracking shows mild saccadic breakdown without nystagmus noted. There is limitation to upper gaze. There is mild decrease in eye blink rate. Hearing is intact. Face is symmetric with mild facial masking and normal facial sensation. There is a mild to at times moderate intermittent lower jaw tremor. Neck is mildly rigid with intact passive ROM. There are no carotid bruits on auscultation. Oropharynx exam reveals mild mouth dryness. No significant airway crowding is noted. Mallampati is class II. Tongue protrudes centrally and palate elevates symmetrically. There is no drooling.   Chest: is clear to auscultation without wheezing, rhonchi or crackles noted.  Heart: sounds are regular and normal without murmurs, rubs or gallops noted.   Abdomen: is soft, non-tender and non-distended with normal bowel sounds appreciated on auscultation.  Extremities: There is no pitting edema in Johnny distal lower extremities bilaterally. Pedal pulses are intact. There are no varicose veins.  Skin: is warm and dry  with no trophic changes noted. Age-related changes are noted on Johnny skin.   Musculoskeletal: exam reveals no obvious  joint deformities, tenderness, joint swelling or erythema. Unremarkable scars on both knees from TKAs and right knee is a little bit bigger than left.   Neurologically:  Mental status: [name] is awake and alert, paying good  attention. [name] is able to completely provide Johnny history. [name] is oriented to: person, place, time/date, situation, day of week, month of year and year. His memory, attention, language and knowledge are intact. On 04/30/2014: MMSE: 30/30, CDT 4/4, AFT: 18/min. There is no aphasia, agnosia, apraxia or anomia. There is a mild degree of bradyphrenia. Speech is moderately hypophonic with no dysarthria noted. Mood is congruent and affect is normal.    Cranial nerves are as described above under HEENT exam. In addition, shoulder shrug is normal with equal shoulder height noted.  Motor exam: Normal bulk, and strength for age is noted. There are mild generalized dyskinesias noted.   Tone is mildly rigid with presence of cogwheeling in Johnny upper extremities. There is overall mild bradykinesia. There is no drift or rebound.  There is a mild resting tremor in Johnny upper extremities and a minimal intermittent tremor in Johnny right lower extremity.  Romberg is negative.  Reflexes are 1+ in Johnny upper extremities, trace in Johnny knees and absent in Johnny ankles.    Fine motor skills exam: Finger taps are moderately impaired on Johnny right and mildly impaired on Johnny left. Hand movements are mildly impaired on Johnny right and mildly impaired on Johnny left. RAP (rapid alternating patting) is mildly impaired on Johnny right and mildly impaired on Johnny left. Foot taps are moderately impaired on Johnny right and moderately impaired on Johnny left. Foot agility (in Johnny form of heel stomping) is mildly impaired on Johnny right and mildly impaired on Johnny left.    Cerebellar testing shows no dysmetria or intention  tremor on finger to nose testing. There is no truncal or gait ataxia.   Sensory exam is intact to light touch, pinprick, vibration, temperature sense in Johnny upper and lower extremities.   Gait, station and balance: [name] stands up from Johnny seated position with mild difficulty and does not need to push up with His hands. [name] needs no assistance. No veering to one side is noted. [name] is not noted to lean to Johnny side. Posture is mildly stooped, appropriate for age in fact. Stance is narrow-based. [name] walks with decrease in stride length and pace and decreased arm swing on both sides. [name] turns in en bloc. Balance is not significantly impaired.      Assessment and Plan:   In summary, [name] is a very pleasant 81 year old gentleman with a history of Parkinson's disease, right-sided predominant of over 25 years' duration. While his physical exam is stable, [name] has had problems maintaining sleep and more anxiety. I suggested we continue with Sinemet at Johnny current dose as well as Azilect. I renewed Johnny prescription for Azilect 1 mg strength for 30 days with refills as [name] requested a month to month prescription and [name] did not need a refill on Sinemet. For his sleep [name] is encouraged to increase melatonin to 3 or even 6 mg as needed, 1-2 hours before projected bedtime. [name] is advised to monitor his own driving and also his daughter is advised to witnesses driving. [name] has not been driving in Johnny dark. [name] has done overall well after his pacemaker but has had increase in anxiety. I suggested a low-dose trial of BuSpar, starting at 5 mg once daily around  4 PM with a second dose potentially as needed in Johnny night when [name] wakes up and feels anxious. [name] can go up to 3 times a day with Johnny 5 mg strength and continue to use melatonin for sleep at Johnny increased dose as well as temazepam if needed. I will see him back in about 4 months, sooner if Johnny need arises. I answered all their questions today and [name] and his  daughter were in agreement. I encouraged [name] to eat healthy, exercise daily and keep well hydrated, to keep a scheduled bedtime and wake time routine, to not skip any meals and eat healthy snacks in between meals and to have protein with every meal. In particular, I stressed Johnny importance of regular exercise, within of course [name]'s own mobility limitations. Of, note, in Johnny past, [name] was not able to tolerate a later dose of Sinemet because of insomnia. In Johnny past [name] also had side effects with some other PD medications and does seem to be sensitive to medications and medication changes. [name] is encouraged to call us with any interim questions, concerns, problems or updates and refill requests. They were in agreement.  Most of my 40 minute visit today was spent in counseling and coordination of care, reviewing test results and reviewing medication.

## 2014-08-20 ENCOUNTER — Telehealth: Payer: Self-pay | Admitting: Neurology

## 2014-09-03 ENCOUNTER — Telehealth: Payer: Self-pay | Admitting: Neurology

## 2014-09-03 ENCOUNTER — Ambulatory Visit (INDEPENDENT_AMBULATORY_CARE_PROVIDER_SITE_OTHER): Payer: Medicare Other | Admitting: Neurology

## 2014-09-03 ENCOUNTER — Encounter: Payer: Self-pay | Admitting: Neurology

## 2014-09-03 VITALS — BP 162/82 | HR 72 | Resp 16 | Ht 65.0 in | Wt 145.0 lb

## 2014-09-03 DIAGNOSIS — G20 Parkinson's disease: Secondary | ICD-10-CM | POA: Diagnosis not present

## 2014-09-03 DIAGNOSIS — F419 Anxiety disorder, unspecified: Secondary | ICD-10-CM | POA: Diagnosis not present

## 2014-09-03 MED ORDER — RASAGILINE MESYLATE 1 MG PO TABS: 1.0000 mg | ORAL_TABLET | Freq: Every day | ORAL | Status: DC

## 2014-09-03 MED ORDER — CARBIDOPA-LEVODOPA 25-100 MG PO TABS: ORAL_TABLET | ORAL | Status: DC

## 2014-09-03 NOTE — Progress Notes (Signed)
Subjective:    Patient ID: [name] is a 81 y.o. male.  HPI     Interim history:   [name] is a very pleasant 81 year old right-handed gentleman with an underlying medical history of hypertension, hyperlipidemia, and Parkinson's disease, status post inguinal hernia repair bilaterally (08/12/12), cataract surgeries, eyelid surgery, and bilateral TKAs (2003 and '04), who presents for followup consultation of his advanced, right-sided predominant Parkinson's disease of over 25 years duration, complicated by dyskinesias, anxiety, and constipation. He is accompanied by his oldest daughter, [name], today. I last saw him on 04/30/2014, at which time he reported not sleeping well. He particularly had trouble staying asleep. He was tried on temazepam by his cardiologist but had residual sedation on even 7.5 mg of temazepam the next day. He had tried low-dose melatonin. This helped him to go to sleep but did not help him to go back to sleep. He was suffering from significant anxiety. I suggested he continue with Sinemet and Azilect at the same dose and for anxiety I suggested we try him on BuSpar starting a low-dose with incremental increase. I also encouraged him to increase his melatonin to 3 mg and perhaps 6 mg of needed. He denied depression. I asked his daughter to monitor his driving. Motor-wise he felt stable. He did have mild dyskinesias.  Today, 09/03/2014: He is able to provide his own history, his provides additional information. He does not feel his medication last him 4 hours. In the past he did not take Sinemet after 4 PM because of insomnia. He has been on temazepam 7.5 mg at night for sleep which has helped. He takes fludrocortisone once daily with dinner. He sees Dr. Einar [name] and cardiology. For anxiety has been using BuSpar 5 mg strength half a pill twice daily. Especially around 4 PM he feels quite anxious consistently. He has no significant depression. He tries to stay active. He  walks 1-1/2-2 miles every day. He may not be drinking enough water. He has dyskinesias which are fairly stable.    Previously:   I saw him on 10/25/2013, at which he reported an improved tremor on the increased dose of C/L, which was 2 pills at 7 AM, 11 AM and 4 PM. He was driving and reported no recent issues with driving, his mood, his memory or with involuntary movements. He reported occasional constipation for which he was taking an over-the-counter stool softener as needed and glycerin suppositories as needed. He takes an afternoon nap around 4 PM. I kept him on Azilect once daily and Sinemet 2 pills 3 times a day. In the interim, he had a cardiac pacemaker placed on 11/03/2013 under Dr. Rayann [name].   I saw him on 04/27/2013, at which time I felt she had remained stable. He did have a recent set back because of a protracted upper respiratory infection but had been feeling better in that regard. I suggested he continue Sinemet 3 times a day because he did not tolerate a later dose of Sinemet because of insomnia in the past. I also suggested he continue with Azilect. He called in June 2015 requesting an increase in his Sinemet to 2 pills 3 times a day. I suggested he try this but watch for side effects such as sleepiness, lightheadedness, drop in blood pressure or recurrence of hallucinations.  He goes to bed at 10 to 10:30 PM and falls asleep quickly. He sees Dr. Einar [name] and has had C.Doppler, echo and has a referral to see Dr. Rayann [name]. He  may need a PM, he stated. He fatigues easily. He had some stressors lately. He lost his nephew last week from lung cancer and he was like a son to the patient. He is coping ok. Sometimes he has difficulty going back to sleep in the early morning hours when he gets up to use the bathroom around 3:30 AM.   I first met him on 10/27/2012, at which time I felt he was doing fairly well. However, I did increase his Sinemet from 3 times a day to 4 times a day scheduling. He was  taking 1-1/2 pills at each dose. I suggested that he take 1-1/2 pills at 7, 11, 3 PM and 7:30 PM. I kept him on the same dose of Azilect. I also referred him for speech therapy because of his hypophonia.   He previously followed with Dr. Morene [name] and was last seen by him on 04/29/2012, at which time Dr. Erling [name] felt that he was doing fairly well. He had some dyskinesias but had a reaction in the past on amantadine including delirium (admitted to [facility] from 7/2/-10/24/04). Dr. Erling [name] did not change his medications at the time.   He has a 25 year history of Parkinson's disease. He was on multiple different medications in the past, including Mirapex, Requip, Comtan, and had side effects. He even was hospitalized in the past because of delirium while on amantadine. He was on bromocriptine in the distant past. Sinemet was restarted in March 2008 and he denied hallucinations, nausea, diarrhea, swallowing or speech difficulties. In January 2014 his MMSE was 30, clock drawing was 4, animal fluency was 22.     His Past Medical History Is Significant For: Past Medical History  Diagnosis Date  . 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 Parkinson's disease 03/20/1996  . Bilateral inguinal hernia (BIH) s/p lap BIH repair 08/12/2012 07/26/2012  . Trifascicular block   . Essential hypertension, benign   . Malaise and fatigue   . Hollenhorst plaque, left eye   . Hyperlipidemia   . Coarse tremors     Benign essential tremors.  . Aortic valve regurgitation   . URI (upper respiratory infection) 05/2013    lasted for most of 2 - 2 1/2 months  . Chronotropic incompetence     His Past Surgical History Is Significant For: Past Surgical History  Procedure Laterality Date  . Cataract eye surgery  08/25/1999  . Total knee arthroplasty  02/24/2002    right knee  . Replacement total knee  02/20/2003  . Revision total knee arthroplasty  08/08/2004    right knee infection  . Revision total knee arthroplasty   09/18/2004    right knee completed  . Brow lift and eyelids  03/06/2008    both eyes  . Laparoscopic inguinal hernia repair Bilateral 08/12/2012  . Hernia repair Bilateral 08/12/12  . Pacemaker insertion  11/03/2013    Biotronik dual chamber pacemaker implanted by Dr [name] for SSS  . Permanent pacemaker insertion N/A 11/03/2013    Procedure: PERMANENT PACEMAKER INSERTION;  Surgeon: Johnny Mark, MD;  Location: [facility] CATH LAB;  Service: Cardiovascular;  Laterality: N/A;    His Family History Is Significant For: Family History  Problem Relation Age of Onset  . Kidney disease Father   . Cancer Father     renal  . COPD Sister   . Cancer Sister 40    Breast  . Congestive Heart Failure Mother     Her  Social History Is Significant For: History   Social History  . Marital Status: Married    Spouse Name: N/A  . Number of Children: N/A  . Years of Education: N/A   Social History Main Topics  . Smoking status: Former Smoker    Quit date: 07/26/1969  . Smokeless tobacco: Not on file  . Alcohol Use: 4.2 oz/week    7 Glasses of wine per week     Comment: wine, occasionally  . Drug Use: No  . Sexual Activity: Yes   Other Topics Concern  . None   Social History Narrative   Right handed, 81 year old retiree.  He previously managed a Civil engineer, contracting.  He has been married for 55 years and has 5 children. He drinks about 1 cup of caffeine daily and 1 glass of wine per day. He has never used illicit drugs.  Quit tonacco in 1971.  Lives with spouse in [place].    His Allergies Are:  Allergies  Allergen Reactions  . Codeine Hives and Rash    *CODEINE-GUAIFENESIN*     *COUGH/COLD/ALLERGY*  . Guaifenesin & Derivatives Rash    *CODEINE-GUAIFENESIN*   *COUGH/COLD/ALLERGY*  :   His Current Medications Are:  Outpatient Encounter Prescriptions as of 09/03/2014  Medication Sig  . amoxicillin (AMOXIL) 500 MG capsule Take 2,000 mg by mouth once as needed.  Marland Kitchen aspirin EC 81 MG tablet Take 81 mg  by mouth daily.  . busPIRone (BUSPAR) 5 MG tablet Take 1 tablet (5 mg total) by mouth 3 (three) times daily as needed.  . carbidopa-levodopa (SINEMET IR) 25-100 MG per tablet Take 2 tablets by mouth 3 (three) times daily.  . carvedilol (COREG) 3.125 MG tablet Take 3.125 mg by mouth once. With meal  . cholecalciferol (VITAMIN D) 1000 UNITS tablet Take 1,000 Units by mouth daily.  Marland Kitchen CINNAMON PO Take 1,000 mg by mouth 2 (two) times daily.  Marland Kitchen esomeprazole (NEXIUM) 40 MG capsule Take 40 mg by mouth daily at 12 noon.  . fludrocortisone (FLORINEF) 0.1 MG tablet   . FLUZONE HIGH-DOSE 0.5 ML SUSY   . MELATONIN PO Take 1.5 mg by mouth daily. Before bedtime as needed  . pravastatin (PRAVACHOL) 40 MG tablet Take 40 mg by mouth every evening.  . rasagiline (AZILECT) 1 MG TABS tablet Take 1 tablet (1 mg total) by mouth daily.  . temazepam (RESTORIL) 7.5 MG capsule   . vitamin B-12 (CYANOCOBALAMIN) 1000 MCG tablet Take 1,000 mcg by mouth daily.  . vitamin C (ASCORBIC ACID) 500 MG tablet Take 500 mg by mouth 2 (two) times daily.   No facility-administered encounter medications on file as of 09/03/2014.  :  Review of Systems:  Out of a complete 14 point review of systems, all are reviewed and negative with the exception of these symptoms as listed below:   Review of Systems  All other systems reviewed and are negative.   Objective:  Neurologic Exam  Physical Exam Physical Examination:   Filed Vitals:   09/03/14 1304  BP: 162/82  Pulse: 72  Resp: 16   General Examination: The patient is a very pleasant 81 y.o. male in no acute distress.  HEENT: Normocephalic, atraumatic, pupils are equal, round and reactive to light and accommodation. Funduscopic exam is normal with sharp disc margins noted. He is status post right cataract repair. Extraocular tracking shows mild saccadic breakdown without nystagmus noted. There is limitation to upper gaze. There is mild decrease in eye blink rate. Hearing is  intact. Face is symmetric with mild facial masking and normal facial sensation. There is a mild to at times moderate intermittent lower jaw tremor. Neck is mildly rigid with intact passive ROM. There are no carotid bruits on auscultation. Oropharynx exam reveals mild mouth dryness. No significant airway crowding is noted. Mallampati is class II. Tongue protrudes centrally and palate elevates symmetrically. There is no drooling.   Chest: is clear to auscultation without wheezing, rhonchi or crackles noted.  Heart: sounds are regular and normal without murmurs, rubs or gallops noted.   Abdomen: is soft, non-tender and non-distended with normal bowel sounds appreciated on auscultation.  Extremities: There is no pitting edema in the distal lower extremities bilaterally. Pedal pulses are intact. There are no varicose veins.  Skin: is warm and dry with no trophic changes noted. Age-related changes are noted on the skin.   Musculoskeletal: exam reveals no obvious joint deformities, tenderness, joint swelling or erythema. Unremarkable scars on both knees from TKAs and right knee is a little bit bigger than left.   Neurologically:  Mental status: The patient is awake and alert, paying good  attention. He is able to completely provide the history. He is oriented to: person, place, time/date, situation, day of week, month of year and year. His memory, attention, language and knowledge are fairly well-preserved.   On 04/30/2014: MMSE: 30/30, CDT 4/4, AFT: 18/min.   There is no aphasia, agnosia, apraxia or anomia. There is a mild degree of bradyphrenia. Speech is moderately hypophonic with no dysarthria noted. Mood is congruent and affect is normal.    Cranial nerves are as described above under HEENT exam. In addition, shoulder shrug is normal with equal shoulder height noted.  Motor exam: Normal bulk, and strength for age is noted. There are mild generalized dyskinesias noted.   Tone is mildly rigid with  presence of cogwheeling in the upper extremities. There is overall mild bradykinesia. There is no drift or rebound.  There is a mild resting tremor in the upper extremities and a minimal intermittent tremor in the right lower extremity.  Romberg is negative.  Reflexes are 1+ in the upper extremities, trace in the knees and absent in the ankles.    Fine motor skills exam: Finger taps are moderately impaired on the right and mildly impaired on the left. Hand movements are mildly impaired on the right and mildly impaired on the left. RAP (rapid alternating patting) is mildly impaired on the right and mildly impaired on the left. Foot taps are moderately impaired on the right and moderately impaired on the left. Foot agility (in the form of heel stomping) is moderately impaired on the right and mildly impaired on the left.    Cerebellar testing shows no dysmetria or intention tremor on finger to nose testing. There is no truncal or gait ataxia.   Sensory exam is intact to light touch, pinprick, vibration, temperature sense in the upper and lower extremities.   Gait, station and balance: He stands up from the seated position with mild difficulty and does not need to push up with His hands. He needs no assistance. No veering to one side is noted. He is not noted to lean to the side. Posture is mildly stooped, seems appropriate for age. Stance is narrow-based. He walks with decrease in stride length and  fairly good pace and decreased arm swing on both sides. He turns in  2 steps. He has slight insecurity with turns. Balance is not significantly impaired.  Assessment and Plan:   In summary, [name] is a very pleasant 81 year old gentleman with a history of Parkinson's disease, right-sided predominant of over 25 years' duration.  he has had some symptom progression and mild progression in his exam. He has been on Sinemet 2 pills 3 times a day for a long time. He still on Azilect 1 mg once daily.  He has been sleeping a little better. For anxiety has suggested that he increase his BuSpar to a whole pill at 4 PM. He may take a third dose if needed. He is also advised to change his Sinemet schedule to 1-1/2 pills 5 times a day, namely at 7, 10, 1 PM, 4 PM and 7 PM. I adjusted his prescription in that regard. We also talked a little bit about potentially utilizing Duopa down the Road. He is advised to drink more water. He is advised to continue to try to stay active mentally and physically.  I will see him back in about 3 months, sooner if the need arises. He is encouraged to call us with any interim questions, concerns, problems or updates and refill requests. I answered all her questions today and the patient and his daughter were in agreement.  Most of my 25 minute visit today was spent in counseling and coordination of care, reviewing test results and reviewing medication.

## 2014-09-03 NOTE — Telephone Encounter (Signed)
[name], Patient's daughter called requesting to speak with someone regarding the dosage and instruction on the script for carbidopa-levodopa (SINEMET IR) 25-100 MG per tablet. Please call and advise. Patient can be reached @ [phone]-patient's number and/or [name] @ [phone]

## 2014-09-03 NOTE — Patient Instructions (Signed)
1. We will change your sinemet schedule to 1 1/2 pills 4 times a day: At 7 AM, 10 AM, 1 PM, 4 PM and 7 PM. Watch for more dyskinesias, low blood pressure or lightheadedness, or sleepiness or hallucinations.   2. Take Buspar 1/2 pill in AM and 1 at 4 PM for anxiety.   3. Drink more water.   4. Keep track of your weight.   5.

## 2014-09-03 NOTE — Telephone Encounter (Signed)
OV note says: He is also advised to change his Sinemet schedule to 1-1/2 pills 5 times a day, namely at 7, 10, 1 PM, 4 PM and 7 PM. I adjusted his prescription in that regard I called back.  Spoke with Ms Loletha Carrow.  Verified the Rx according to instructions listed.  She verbalized understanding.  Asked that I call the patient back as well.  I called and spoke with him, relayed instructions.  He verbalized understanding.

## 2014-10-15 ENCOUNTER — Other Ambulatory Visit: Payer: Self-pay

## 2014-12-04 ENCOUNTER — Ambulatory Visit (INDEPENDENT_AMBULATORY_CARE_PROVIDER_SITE_OTHER): Payer: Medicare Other | Admitting: Neurology

## 2014-12-04 ENCOUNTER — Encounter: Payer: Self-pay | Admitting: Neurology

## 2014-12-04 VITALS — BP 152/78 | HR 60 | Resp 14 | Ht 65.0 in | Wt 158.0 lb

## 2014-12-04 DIAGNOSIS — R419 Unspecified symptoms and signs involving cognitive functions and awareness: Secondary | ICD-10-CM | POA: Diagnosis not present

## 2014-12-04 DIAGNOSIS — G20 Parkinson's disease: Secondary | ICD-10-CM | POA: Diagnosis not present

## 2014-12-04 DIAGNOSIS — I480 Paroxysmal atrial fibrillation: Secondary | ICD-10-CM | POA: Diagnosis not present

## 2014-12-04 DIAGNOSIS — F419 Anxiety disorder, unspecified: Secondary | ICD-10-CM | POA: Diagnosis not present

## 2014-12-04 NOTE — Progress Notes (Signed)
Subjective:    Patient ID: [name] is a 81 y.o. male.  HPI     Interim history:   [name] is a very pleasant 81 year old right-handed gentleman with an underlying medical history of hypertension, hyperlipidemia, paroxysmal A. fib, cardiac pacemaker in situ (11/03/13 under Dr. Joylene Grapes), sick sinus syndrome, nocturnal hypoxemia, status post inguinal hernia repair bilaterally (08/12/12), cataract surgeries, eyelid surgery, and bilateral TKAs (2003 and '04), who presents for followup consultation of his advanced, right-sided predominant Parkinson's disease of over 25 years duration, complicated by dyskinesias, anxiety, and constipation as well as cognitive complaints. He is accompanied by his oldest daughter, [name], again today. I last saw him on 09/03/2014, at which time he reported that his Sinemet was not lasting him 4 hours. In the past he had insomnia after taking Sinemet post 4 PM. He was on temazepam for sleep. He was using low-dose BuSpar for anxiety. Especially around 4 PM he noticed flareup in his anxiety consistently. He was still trying to stay active, walking 1-1/2-2 miles each day. He was not drinking enough water. He saw his cardiologist, Dr. Einar Gip in the interim and also talked to Dr. Einar Gip on the phone.   Today, 12/04/2014: He reports that he is doing fairly well. He was recently started on Eliquis for his A fib. He has an appointment with Dr. Einar Gip in October. He stays active. He takes C/L 1 1/2 5 times a day. He has not had any interim episodes of A fib, as far as he knows. He was found to be in A fib for about 3 hours in July. This is when he started Eliquis. He takes Buspar 1/2 pill in the afternoons and sometimes 1/2 pill in the AM. His anxiety is improved. He has not yet increased the BuSpar to 1 pill in the afternoon. He has noticed an increase in urinary frequency when his anxiety level increases. He has occasional constipation. Sometimes he uses glycerin suppositories up  to twice a week. He had recent blood work through his primary care physician's office which I reviewed: This was from 11/12/2014. CBC with differential was normal with the exception of hemoglobin at 13.2. Lipid profile was excellent with total cholesterol at 119, triglycerides at 44, LDL at 61, all at goal. PSA slightly elevated at 4.38. He states that he sees a urologist regularly. TSH was normal. Urinalysis was negative with the exception of urine protein at 30. CMP was unremarkable, with the exception of BUN at 23.  Previously:  I saw him on 04/30/2014, at which time he reported not sleeping well. He particularly had trouble staying asleep. He was tried on temazepam by his cardiologist but had residual sedation on even 7.5 mg of temazepam the next day. He had tried low-dose melatonin. This helped him to go to sleep but did not help him to go back to sleep. He was suffering from significant anxiety. I suggested he continue with Sinemet and Azilect at the same dose and for anxiety I suggested we try him on BuSpar starting a low-dose with incremental increase. I also encouraged him to increase his melatonin to 3 mg and perhaps 6 mg of needed. He denied depression. I asked his daughter to monitor his driving. Motor-wise he felt stable. He did have mild dyskinesias.   I saw him on 10/25/2013, at which he reported an improved tremor on the increased dose of C/L, which was 2 pills at 7 AM, 11 AM and 4 PM. He was driving and reported  no recent issues with driving, his mood, his memory or with involuntary movements. He reported occasional constipation for which he was taking an over-the-counter stool softener as needed and glycerin suppositories as needed. He takes an afternoon nap around 4 PM. I kept him on Azilect once daily and Sinemet 2 pills 3 times a day. In the interim, he had a cardiac pacemaker placed on 11/03/2013 under Dr. Rayann Heman.   I saw him on 04/27/2013, at which time I felt she had remained  stable. He did have a recent set back because of a protracted upper respiratory infection but had been feeling better in that regard. I suggested he continue Sinemet 3 times a day because he did not tolerate a later dose of Sinemet because of insomnia in the past. I also suggested he continue with Azilect. He called in June 2015 requesting an increase in his Sinemet to 2 pills 3 times a day. I suggested he try this but watch for side effects such as sleepiness, lightheadedness, drop in blood pressure or recurrence of hallucinations.  He goes to bed at 10 to 10:30 PM and falls asleep quickly. He sees Dr. Einar Gip and has had C.Doppler, echo and has a referral to see Dr. Rayann Heman. He may need a PM, he stated. He fatigues easily. He had some stressors lately. He lost his nephew last week from lung cancer and he was like a son to the patient. He is coping ok. Sometimes he has difficulty going back to sleep in the early morning hours when he gets up to use the bathroom around 3:30 AM.   I first met him on 10/27/2012, at which time I felt he was doing fairly well. However, I did increase his Sinemet from 3 times a day to 4 times a day scheduling. He was taking 1-1/2 pills at each dose. I suggested that he take 1-1/2 pills at 7, 11, 3 PM and 7:30 PM. I kept him on the same dose of Azilect. I also referred him for speech therapy because of his hypophonia.   He previously followed with Dr. Morene Antu and was last seen by him on 04/29/2012, at which time Dr. Erling Cruz felt that he was doing fairly well. He had some dyskinesias but had a reaction in the past on amantadine including delirium (admitted to [facility] from 7/2/-10/24/04). Dr. Erling Cruz did not change his medications at the time.   He has a 25 year history of Parkinson's disease. He was on multiple different medications in the past, including Mirapex, Requip, Comtan, and had side effects. He even was hospitalized in the past because of delirium while on amantadine. He was on  bromocriptine in the distant past. Sinemet was restarted in March 2008 and he denied hallucinations, nausea, diarrhea, swallowing or speech difficulties. In January 2014 his MMSE was 30, clock drawing was 4, animal fluency was 22.     His Past Medical History Is Significant For: Past Medical History  Diagnosis Date  . 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 Parkinson's disease 03/20/1996  . Bilateral inguinal hernia (BIH) s/p lap BIH repair 08/12/2012 07/26/2012  . Trifascicular block   . Essential hypertension, benign   . Malaise and fatigue   . Hollenhorst plaque, left eye   . Hyperlipidemia   . Coarse tremors     Benign essential tremors.  . Aortic valve regurgitation   . URI (upper respiratory infection) 05/2013    lasted for most of 2 - 2  1/2 months  . Chronotropic incompetence     His Past Surgical History Is Significant For: Past Surgical History  Procedure Laterality Date  . Cataract eye surgery  08/25/1999  . Total knee arthroplasty  02/24/2002    right knee  . Replacement total knee  02/20/2003  . Revision total knee arthroplasty  08/08/2004    right knee infection  . Revision total knee arthroplasty  09/18/2004    right knee completed  . Brow lift and eyelids  03/06/2008    both eyes  . Laparoscopic inguinal hernia repair Bilateral 08/12/2012  . Hernia repair Bilateral 08/12/12  . Pacemaker insertion  11/03/2013    Biotronik dual chamber pacemaker implanted by Dr Rayann Heman for SSS  . Permanent pacemaker insertion N/A 11/03/2013    Procedure: PERMANENT PACEMAKER INSERTION;  Surgeon: Coralyn Mark, MD;  Location: [facility];  Service: Cardiovascular;  Laterality: N/A;    His Family History Is Significant For: Family History  Problem Relation Age of Onset  . Kidney disease Father   . Cancer Father     renal  . COPD Sister   . Cancer Sister 77    Breast  . Congestive Heart Failure Mother     His Social History Is Significant For: Social History    Social History  . Marital Status: Married    Spouse Name: N/A  . Number of Children: N/A  . Years of Education: N/A   Social History Main Topics  . Smoking status: Former Smoker    Quit date: 07/26/1969  . Smokeless tobacco: None  . Alcohol Use: 4.2 oz/week    7 Glasses of wine per week     Comment: wine, occasionally  . Drug Use: No  . Sexual Activity: Yes   Other Topics Concern  . None   Social History Narrative   Right handed, 82 year old retiree.  He previously managed a Civil engineer, contracting.  He has been married for 55 years and has 5 children. He drinks about 1 cup of caffeine daily and 1 glass of wine per day. He has never used illicit drugs.  Quit tonacco in 1971.  Lives with spouse in [place].    His Allergies Are:  Allergies  Allergen Reactions  . Codeine Hives and Rash    *CODEINE-GUAIFENESIN*     *COUGH/COLD/ALLERGY*  . Guaifenesin & Derivatives Rash    *CODEINE-GUAIFENESIN*   *COUGH/COLD/ALLERGY*  :   His Current Medications Are:  Outpatient Encounter Prescriptions as of 12/04/2014  Medication Sig  . amoxicillin (AMOXIL) 500 MG capsule Take 2,000 mg by mouth once as needed.  . busPIRone (BUSPAR) 5 MG tablet Take 1 tablet (5 mg total) by mouth 3 (three) times daily as needed.  . carbidopa-levodopa (SINEMET IR) 25-100 MG per tablet Take 1 1/2 pills 5 times a day.  . carvedilol (COREG) 3.125 MG tablet Take 3.125 mg by mouth once. With meal  . cholecalciferol (VITAMIN D) 1000 UNITS tablet Take 1,000 Units by mouth daily.  Marland Kitchen CINNAMON PO Take 1,000 mg by mouth 2 (two) times daily.  Marland Kitchen ELIQUIS 5 MG TABS tablet TK 1 T PO  BID WITH FOOD  . esomeprazole (NEXIUM) 40 MG capsule Take 40 mg by mouth daily at 12 noon.  . fludrocortisone (FLORINEF) 0.1 MG tablet   . pravastatin (PRAVACHOL) 40 MG tablet Take 40 mg by mouth every evening.  . rasagiline (AZILECT) 1 MG TABS tablet Take 1 tablet (1 mg total) by mouth daily.  . temazepam (RESTORIL)  7.5 MG capsule   . vitamin B-12  (CYANOCOBALAMIN) 1000 MCG tablet Take 1,000 mcg by mouth daily.  . vitamin C (ASCORBIC ACID) 500 MG tablet Take 500 mg by mouth 2 (two) times daily.  . [DISCONTINUED] aspirin EC 81 MG tablet Take 81 mg by mouth daily.  . [DISCONTINUED] FLUZONE HIGH-DOSE 0.5 ML SUSY   . [DISCONTINUED] MELATONIN PO Take 1.5 mg by mouth daily. Before bedtime as needed   No facility-administered encounter medications on file as of 12/04/2014.  :  Review of Systems:  Out of a complete 14 point review of systems, all are reviewed and negative with the exception of these symptoms as listed below:   Review of Systems  Cardiovascular:       Started Eliquis   Neurological:       Patient feels like he is doing well taking Sinemet 5 times a day. Takes Buspar 1/2 tab in morning and in evening.     Objective:  Neurologic Exam  Physical Exam Physical Examination:   Filed Vitals:   12/04/14 1316  BP: 152/78  Pulse: 60  Resp: 14   General Examination: The patient is a very pleasant 81 y.o. male in no acute distress. He is in good spirits and indeed less anxious appearing today.  HEENT: Normocephalic, atraumatic, pupils are equal, round and reactive to light and accommodation. Funduscopic exam is normal with sharp disc margins noted. He is status post right cataract repair. Extraocular tracking shows mild saccadic breakdown without nystagmus noted. There is limitation to upper gaze. There is mild decrease in eye blink rate. Hearing is intact. Face is symmetric with mild facial masking and normal facial sensation. There is a mild to at times moderate intermittent lower jaw tremor. Neck is mildly rigid with intact passive ROM. There are no carotid bruits on auscultation. Oropharynx exam reveals mild mouth dryness. No significant airway crowding is noted. Mallampati is class II. Tongue protrudes centrally and palate elevates symmetrically. There is no drooling.   Chest: is clear to auscultation without wheezing, rhonchi  or crackles noted.  Heart: sounds are regular and normal without murmurs, rubs or gallops noted.   Abdomen: is soft, non-tender and non-distended with normal bowel sounds appreciated on auscultation.  Extremities: There is no pitting edema in the distal lower extremities bilaterally. Pedal pulses are intact. There are no varicose veins.  Skin: is warm and dry with no trophic changes noted. Age-related changes are noted on the skin.   Musculoskeletal: exam reveals no obvious joint deformities, tenderness, joint swelling or erythema. Unremarkable scars on both knees from TKAs and right knee is a little bit bigger than left, unchanged.   Neurologically:  Mental status: The patient is awake and alert, paying good  attention. He is able to completely provide the history. He is oriented to: person, place, time/date, situation, day of week, month of year and year. His memory, attention, language and knowledge are fairly well-preserved.   On 04/30/2014: MMSE: 30/30, CDT 4/4, AFT: 18/min.   There is no aphasia, agnosia, apraxia or anomia. There is a mild degree of bradyphrenia. Speech is moderately hypophonic with no dysarthria noted. Mood is congruent and affect is normal.    Cranial nerves are as described above under HEENT exam. In addition, shoulder shrug is normal with equal shoulder height noted.  Motor exam: Normal bulk, and strength for age is noted. There are mild generalized dyskinesias noted.   Tone is mildly rigid with presence of cogwheeling in the upper extremities.  There is overall mild bradykinesia. There is no drift or rebound.  There is a mild resting tremor in the upper extremities and a minimal intermittent tremor in the right lower extremity.  Romberg is negative.  Reflexes are 1+ in the upper extremities, trace in the knees and absent in the ankles.    Fine motor skills exam: Finger taps are moderately impaired on the right and mildly impaired on the left. Hand movements are  mildly impaired on the right and mildly impaired on the left. RAP (rapid alternating patting) is mildly impaired on the right and mildly impaired on the left. Foot taps are moderately impaired on the right and moderately impaired on the left. Foot agility (in the form of heel stomping) is moderately impaired on the right and mildly impaired on the left.    Cerebellar testing shows no dysmetria or intention tremor on finger to nose testing. There is no truncal or gait ataxia.   Sensory exam is intact to light touch in the upper and lower extremities.   Gait, station and balance: He stands up from the seated position with mild difficulty and does not need to push up with His hands. He needs no assistance. No veering to one side is noted. He is not noted to lean to the side. Posture is mildly stooped, seems appropriate for age. Stance is narrow-based. He walks with decrease in stride length and fairly good pace and decreased arm swing on both sides. He turns in  2 steps. He has slight insecurity with turns. Balance is mildly impaired.      Assessment and Plan:   In summary, [name] is a very pleasant 81 year old gentleman with an underlying medical history of hypertension, hyperlipidemia, paroxysmal A. fib, cardiac pacemaker in situ, sick sinus syndrome, nocturnal hypoxemia, status post inguinal hernia repair bilaterally (08/12/12), cataract surgeries, eyelid surgery, and bilateral TKAs (2003 and '04), who presents for followup consultation of his advanced, right-sided predominant Parkinson's disease of over 25 years duration, complicated by dyskinesias, anxiety, and constipation as well as cognitive complaints. He is currently on Sinemet 1 1/2 pills 5 times a day and he is on Azilect 1 mg once daily. He has been sleeping a little better. For anxiety he has been using BuSpar half a pill in the afternoon and an occasional half pill in the morning as well. He is encouraged to increase his afternoon  dose to a full pill each afternoon when the anxiety typically is worse. He is encouraged to try this for at least 3-4 days to notice a sustained response to it. We will continue with Sinemet 1-1/2 pills 5 times a day and Azilect once daily. He did not need a refill today. I would like to see him back in 4 months, sooner if needed. Overall he looks well today. He feels better. I answered all her questions today and the patient and his daughter were in agreement.  Most of my 25 minute visit today was spent in counseling and coordination of care and discussing the diagnosis of advanced Parkinson's disease, its prognosis and treatment options.

## 2014-12-04 NOTE — Patient Instructions (Addendum)
I think your Parkinson's disease has remained fairly stable, which is reassuring. Nevertheless, as you know, this disease does progress with time. It can affect your balance, your memory, your mood, your bowel and bladder function, your posture, balance and walking. Overall you are doing fairly well but I do want to suggest a few things today:  Remember to drink plenty of fluid, eat healthy meals and do not skip any meals. Try to eat protein with a every meal and eat a healthy snack such as fruit or nuts in between meals. Try to keep a regular sleep-wake schedule and try to exercise daily, particularly in the form of walking, 20-30 minutes a day, if you can.   Taking your medication on schedule is key.   Try to stay active physically and mentally. Engage in social activities in your community and with your family and try to keep up with current events by reading the newspaper or watching the news. Try to do word puzzles and you may like to do word puzzles and brain games on the computer such as on [email or web address]   As far as your medications are concerned, I would like to suggest that you take your current medication with no change today. Again, you can increase your Buspar to a full pill in the afternoon.   I would like to see you back in 4 months, sooner if we need to. Please call us with any interim questions, concerns, problems, updates or refill requests.  Our phone number is [phone]. We also have an after hours call service for urgent matters and there is a physician on-call for urgent questions, that cannot wait till the next work day. For any emergencies you know to call 911 or go to the nearest emergency room.

## 2015-03-22 ENCOUNTER — Other Ambulatory Visit: Payer: Self-pay | Admitting: Neurology

## 2015-03-28 ENCOUNTER — Emergency Department (HOSPITAL_BASED_OUTPATIENT_CLINIC_OR_DEPARTMENT_OTHER)
Admission: EM | Admit: 2015-03-28 | Discharge: 2015-03-28 | Disposition: A | Discharge status: Home / Self Care | Payer: Medicare Other | Attending: Emergency Medicine | Admitting: Emergency Medicine

## 2015-03-28 ENCOUNTER — Encounter (HOSPITAL_BASED_OUTPATIENT_CLINIC_OR_DEPARTMENT_OTHER): Payer: Self-pay

## 2015-03-28 ENCOUNTER — Emergency Department (HOSPITAL_BASED_OUTPATIENT_CLINIC_OR_DEPARTMENT_OTHER): Payer: Medicare Other

## 2015-03-28 DIAGNOSIS — Z79899 Other long term (current) drug therapy: Secondary | ICD-10-CM | POA: Insufficient documentation

## 2015-03-28 DIAGNOSIS — W01198A Fall on same level from slipping, tripping and stumbling with subsequent striking against other object, initial encounter: Secondary | ICD-10-CM | POA: Diagnosis not present

## 2015-03-28 DIAGNOSIS — Y9289 Other specified places as the place of occurrence of the external cause: Secondary | ICD-10-CM | POA: Diagnosis not present

## 2015-03-28 DIAGNOSIS — G20 Parkinson's disease: Secondary | ICD-10-CM | POA: Diagnosis not present

## 2015-03-28 DIAGNOSIS — E785 Hyperlipidemia, unspecified: Secondary | ICD-10-CM | POA: Diagnosis not present

## 2015-03-28 DIAGNOSIS — Z85828 Personal history of other malignant neoplasm of skin: Secondary | ICD-10-CM | POA: Insufficient documentation

## 2015-03-28 DIAGNOSIS — Z87891 Personal history of nicotine dependence: Secondary | ICD-10-CM | POA: Insufficient documentation

## 2015-03-28 DIAGNOSIS — S60221A Contusion of right hand, initial encounter: Secondary | ICD-10-CM | POA: Insufficient documentation

## 2015-03-28 DIAGNOSIS — Z8719 Personal history of other diseases of the digestive system: Secondary | ICD-10-CM | POA: Diagnosis not present

## 2015-03-28 DIAGNOSIS — Y998 Other external cause status: Secondary | ICD-10-CM | POA: Diagnosis not present

## 2015-03-28 DIAGNOSIS — I10 Essential (primary) hypertension: Secondary | ICD-10-CM | POA: Diagnosis not present

## 2015-03-28 DIAGNOSIS — Z950 Presence of cardiac pacemaker: Secondary | ICD-10-CM | POA: Insufficient documentation

## 2015-03-28 DIAGNOSIS — Z8709 Personal history of other diseases of the respiratory system: Secondary | ICD-10-CM | POA: Insufficient documentation

## 2015-03-28 DIAGNOSIS — S62346A Nondisplaced fracture of base of fifth metacarpal bone, right hand, initial encounter for closed fracture: Secondary | ICD-10-CM | POA: Insufficient documentation

## 2015-03-28 DIAGNOSIS — I4891 Unspecified atrial fibrillation: Secondary | ICD-10-CM | POA: Insufficient documentation

## 2015-03-28 DIAGNOSIS — Y9389 Activity, other specified: Secondary | ICD-10-CM | POA: Insufficient documentation

## 2015-03-28 DIAGNOSIS — S6991XA Unspecified injury of right wrist, hand and finger(s), initial encounter: Secondary | ICD-10-CM | POA: Diagnosis present

## 2015-03-28 DIAGNOSIS — S62309A Unspecified fracture of unspecified metacarpal bone, initial encounter for closed fracture: Secondary | ICD-10-CM

## 2015-03-28 HISTORY — DX: Unspecified atrial fibrillation: I48.91

## 2015-03-28 MED ORDER — TRAMADOL HCL 50 MG PO TABS: 50.0000 mg | ORAL_TABLET | Freq: Four times a day (QID) | ORAL | Status: DC | PRN

## 2015-03-28 NOTE — ED Notes (Signed)
Right hand injury from trip/fall today

## 2015-03-28 NOTE — ED Notes (Addendum)
Dropped clothes and tripped over them  Hit wall and floor wm rt hand  Some swelling noted w bruising to palm of hand

## 2015-03-28 NOTE — ED Notes (Addendum)
MD Rogene Houston was informed that we did not have a velcro ulnar gutter splint and was ok with a fiberglass ulnar gutter splint. Had to unwrap splint due to finger tips turning purple. Once splint was unwrapped finger tips returned a normal color. Splint was rewrapped for a second time and circulation stayed a normal color. RN Sam was informed that I had to rewrapped and Pts family member the Daughter was informed why I had to unwrapped splint.

## 2015-03-28 NOTE — Discharge Instructions (Signed)
Elevate the right hand and arm is much as possible. Wear the splint. Can remove it for bathing then reapply it. Call your orthopedic group for follow-up. Will need to be followed by hand surgeon. Take the tramadol as needed for pain. Return for any new or worse symptoms.

## 2015-03-28 NOTE — ED Provider Notes (Addendum)
CSN: [id number]     Arrival date & time 03/28/15  2056 History  By signing my name below, I, Soijett Blue, attest that this documentation has been prepared under the direction and in the presence of Fredia Sorrow, MD. Electronically Signed: Soijett Blue, ED Scribe. 03/28/2015. 10:42 PM.   Chief Complaint  Patient presents with  . Hand Injury      The history is provided by the patient. No language interpreter was used.    [name] is a 81 y.o. male with a medical hx of Parkinsons disease, HTN, A-fib who presents to the Emergency Department complaining of right hand injury with associated pain and swelling onset 3 PM PTA. He reports that he tripped over clothes that were on the ground. He states that there was not pain initially but now there is increased pain to his pinky of his right hand that he rates as 6/10. Pt is having associated symptoms of right hand swelling, bruise/bleed easily. He notes that he has tried ice with no relief of his symptoms. He denies leg pain/swelling, fever, chills, cough, rhinorrhea, sore throat, SOB, CP, abdominal pain, n/v/d, rash, HA, back pain, neck pain, and any other symptoms. He states that he takes eliquis daily for his A-fib.   Past Medical History  Diagnosis Date  . 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 Parkinson's disease ([place]) 03/20/1996  . Bilateral inguinal hernia (BIH) s/p lap BIH repair 08/12/2012 07/26/2012  . Trifascicular block   . Essential hypertension, benign   . Malaise and fatigue   . Hollenhorst plaque, left eye   . Hyperlipidemia   . Coarse tremors     Benign essential tremors.  . Aortic valve regurgitation   . URI (upper respiratory infection) 05/2013    lasted for most of 2 - 2 1/2 months  . Chronotropic incompetence   . A-fib [facility])    Past Surgical History  Procedure Laterality Date  . Cataract eye surgery  08/25/1999  . Total knee arthroplasty  02/24/2002    right knee  . Replacement total knee   02/20/2003  . Revision total knee arthroplasty  08/08/2004    right knee infection  . Revision total knee arthroplasty  09/18/2004    right knee completed  . Brow lift and eyelids  03/06/2008    both eyes  . Laparoscopic inguinal hernia repair Bilateral 08/12/2012  . Hernia repair Bilateral 08/12/12  . Pacemaker insertion  11/03/2013    Biotronik dual chamber pacemaker implanted by Dr Rayann Heman for SSS  . Permanent pacemaker insertion N/A 11/03/2013    Procedure: PERMANENT PACEMAKER INSERTION;  Surgeon: Coralyn Mark, MD;  Location: [place] CATH LAB;  Service: Cardiovascular;  Laterality: N/A;   Family History  Problem Relation Age of Onset  . Kidney disease Father   . Cancer Father     renal  . COPD Sister   . Cancer Sister 26    Breast  . Congestive Heart Failure Mother    Social History  Substance Use Topics  . Smoking status: Former Smoker    Quit date: 07/26/1969  . Smokeless tobacco: None  . Alcohol Use: 4.2 oz/week    7 Glasses of wine per week     Comment: wine, occasionally    Review of Systems  Constitutional: Negative for fever and chills.  HENT: Negative for congestion, rhinorrhea and sore throat.   Eyes: Negative for visual disturbance.  Respiratory: Negative for cough and shortness of  breath.   Cardiovascular: Negative for chest pain and leg swelling.  Gastrointestinal: Negative for nausea, vomiting, abdominal pain and diarrhea.  Genitourinary: Negative for dysuria and hematuria.  Musculoskeletal: Positive for arthralgias (right hand). Negative for back pain and neck pain.  Skin: Positive for color change (bruising to right hand). Negative for rash.  Neurological: Negative for headaches.  Hematological: Bruises/bleeds easily.  Psychiatric/Behavioral: Negative for confusion.      Allergies  Codeine and Guaifenesin & derivatives  Home Medications   Prior to Admission medications   Medication Sig Start Date End Date Taking? Authorizing Provider  amoxicillin  (AMOXIL) 500 MG capsule Take 2,000 mg by mouth once as needed.    Historical Provider, MD  busPIRone (BUSPAR) 5 MG tablet Take 1 tablet (5 mg total) by mouth 3 (three) times daily as needed. 04/30/14   Star Age, MD  carbidopa-levodopa (SINEMET IR) 25-100 MG tablet TAKE 1 AND 1/2 TABLETS BY MOUTH FIVE TIMES DAILY 03/24/15   Star Age, MD  carvedilol (COREG) 3.125 MG tablet Take 3.125 mg by mouth once. With meal    Historical Provider, MD  cholecalciferol (VITAMIN D) 1000 UNITS tablet Take 1,000 Units by mouth daily.    Historical Provider, MD  CINNAMON PO Take 1,000 mg by mouth 2 (two) times daily.    Historical Provider, MD  ELIQUIS 5 MG TABS tablet TK 1 T PO  BID WITH FOOD 11/10/14   Historical Provider, MD  esomeprazole (NEXIUM) 40 MG capsule Take 40 mg by mouth daily at 12 noon.    Historical Provider, MD  fludrocortisone (FLORINEF) 0.1 MG tablet  04/18/14   Historical Provider, MD  pravastatin (PRAVACHOL) 40 MG tablet Take 40 mg by mouth every evening.    Historical Provider, MD  rasagiline (AZILECT) 1 MG TABS tablet Take 1 tablet (1 mg total) by mouth daily. 09/03/14   Star Age, MD  temazepam (RESTORIL) 7.5 MG capsule  04/21/14   Historical Provider, MD  traMADol (ULTRAM) 50 MG tablet Take 1 tablet (50 mg total) by mouth every 6 (six) hours as needed. 03/28/15   Fredia Sorrow, MD  vitamin B-12 (CYANOCOBALAMIN) 1000 MCG tablet Take 1,000 mcg by mouth daily.    Historical Provider, MD  vitamin C (ASCORBIC ACID) 500 MG tablet Take 500 mg by mouth 2 (two) times daily.    Historical Provider, MD   BP 153/107 mmHg  Pulse 76  Temp(Src) 97.8 F (36.6 C) (Oral)  Resp 18  Ht 5\' 6"  (1.676 m)  Wt 68.04 kg  BMI 24.22 kg/m2  SpO2 100% Physical Exam  Constitutional: He is oriented to person, place, and time. He appears well-developed and well-nourished. No distress.  HENT:  Head: Normocephalic and atraumatic.  Eyes: Conjunctivae and EOM are normal. No scleral icterus.  Neck: Neck supple.   Cardiovascular: Normal rate, regular rhythm and normal heart sounds.  Exam reveals no gallop and no friction rub.   No murmur heard. Pulmonary/Chest: Effort normal and breath sounds normal. No respiratory distress. He has no wheezes. He has no rales.  Abdominal: Soft. There is no tenderness.  Musculoskeletal:  No ankle swelling.  Right hand: Cap refill is 1 second in right fingers. Radial pulse 2 +. Sensation intact. Bruising over the distal 4th metacarpal and 5th metacarpal. Bruising on the ulnar side of the palm. Tremor noted.   Neurological: He is alert and oriented to person, place, and time. No cranial nerve deficit. He exhibits normal muscle tone. Coordination normal.  Skin: Skin is warm  and dry. No rash noted. He is not diaphoretic.  Psychiatric: He has a normal mood and affect. His behavior is normal.  Nursing note and vitals reviewed.   ED Course  Procedures (including critical care time) DIAGNOSTIC STUDIES: Oxygen Saturation is 100% on RA, nl by my interpretation.    COORDINATION OF CARE: 10:40 PM Discussed treatment plan with pt at bedside which includes right hand xray, velcro splint, referral to hand surgeon and pt agreed to plan.    Labs Review Labs Reviewed - No data to display  Imaging Review Dg Hand Complete Right  03/28/2015  CLINICAL DATA:  [place] 2-3 hours ago, striking hand on floor. On Eliquis. EXAM: RIGHT HAND - COMPLETE 3+ VIEW COMPARISON:  None. FINDINGS: Nondisplaced base of fifth metacarpal fracture with suspected intra-articular extension best seen on the frontal radiograph. No dislocation. No destructive bony lesions. Mild osteopenia, decreasing sensitivity for acute nondisplaced fractures. Multifocal moderate degenerative joint disease. Soft tissue planes are nonsuspicious. IMPRESSION: Acute nondisplaced base of fifth metacarpal fracture without dislocation. Electronically Signed   By: Elon Alas M.D.   On: 03/28/2015 21:53   I have personally  reviewed and evaluated these images as part of my medical decision-making.   EKG Interpretation None      MDM   Final diagnoses:  Metacarpal bone fracture, closed, initial encounter    A patient with a fall this afternoon striking his right hand on the wall and then the floor. X-rays show nondisplaced base of the fifth metacarpal fracture. Will treat with a Velcro ulnar gutter splint and have him follow-up with hand surgery. Patient does have some bruising to the area and to the palm patient is on blood thinners. On blood thinners for atrial fibrillation. Patient did not hit his head. Not complaining of neck back pain hip or leg pain. No other injuries.  Patient is right-hand dominant.  I personally performed the services described in this documentation, which was scribed in my presence. The recorded information has been reviewed and is accurate.      Fredia Sorrow, MD 03/28/15 [phone] do itnaproxen  Fredia Sorrow, MD 03/28/15 [phone]

## 2015-04-02 ENCOUNTER — Telehealth: Payer: Self-pay | Admitting: Neurology

## 2015-04-02 NOTE — Telephone Encounter (Signed)
I spoke to daughter and she is aware of advice below. She asks for Dr. Rexene Alberts to look more into this and discuss with patient at next appt.  Patient's appt is on 12/28 but I advised daughter that we need to move it due to Dr. Rexene Alberts out of office. We moved appt to 12/29 at 2:30. Daughter asked that I call the patient with this appt time. I called and left a message for the patient letting him know that we moved his appt to 12/29 at 2:30 and to call us if that does not work for him.

## 2015-04-02 NOTE — Telephone Encounter (Signed)
Unfortunately I am not familiar with it and its efficacy for PD, therefore cannot make recommendations in that regard and also cannot prescribe it. Please let daughter know.

## 2015-04-02 NOTE — Telephone Encounter (Signed)
Pt's daughter called and wanted to know if Dr. Rexene Alberts thought it would be ok to take Cannibus oil for Parkinsons. She says that Dr. Einar Gip told them that he was fine with it, but they wanted to check with Dr. Rexene Alberts first. Daughter says there is medical grade oil available in [place]. She wants to know if pt would need a Rx for it and if so could they have one . Please call and advise [phone] daughters , Loletha Carrow, Vernetta Honey

## 2015-04-02 NOTE — Telephone Encounter (Signed)
Janett Billow, do you have any further advise or input on this from the pharmaceutical side?

## 2015-04-03 NOTE — Telephone Encounter (Signed)
I apologize for the delay, I was out of the office.  Included below is the info I was able to find.  Hopefully this helps.  If you need anything else, please let me know!   Research on Cannabinoids and Parkinson's to Date Is Inconclusive?Pre-clinical research (including work supported by [employer] in 2007 and 2012) has concentrated on learning about the structure and function of the endocannabinoid system, while determining if cannabinoids could help in Parkinson's. Early data suggests that cannabinoids have antioxidant, anti-inflammatory and other properties that could be neuroprotective. Pre-clinical studies evaluating symptomatic effects, though -- improvement in motor symptoms and levodopa-induced dyskinesia -- have produced varied results. Clinical studies have demonstrated similarly inconsistent outcomes. Several cannabinoid trials have reported benefit on Parkinson's motor and non-motor symptoms including pain, sleep dysfunction, rapid eye movement sleep behavior disorder and psychosis. (Of course, numerous anecdotal accounts exist of marijuana aiding with these and a variety of other symptoms as well.) Complicating matters, four controlled clinical trials concluded that cannabinoids did not lessen motor symptoms but had mixed results regarding levodopa-induced dyskinesia. The positive results should be read cautiously for several reasons: . Small numbers of patients were enrolled, . Many of the studies were observational (patients self-reported results . through questionnaires) or uncontrolled and open-label (all . participants took the study drug and were aware of this), and different . formulations (smoked cannabis, oral cannabinoids, etc.) and doses of . marijuana and its derivatives were utilized. The trials with negative results should also be interpreted carefully since they too included small numbers of volunteers and used varied doses and formulations of cannabinoids. However, they were  placebo-controlled and therefore provide stronger evidence in support of the current prevailing clinical viewpoint, which is that cannabinoids are probably ineffective for Parkinson's motor symptoms and levodopa-induced dyskinesia.

## 2015-04-17 ENCOUNTER — Ambulatory Visit: Payer: Medicare Other | Admitting: Neurology

## 2015-04-17 ENCOUNTER — Other Ambulatory Visit: Payer: Self-pay | Admitting: Neurology

## 2015-04-18 ENCOUNTER — Encounter: Payer: Self-pay | Admitting: Neurology

## 2015-04-18 ENCOUNTER — Ambulatory Visit (INDEPENDENT_AMBULATORY_CARE_PROVIDER_SITE_OTHER): Payer: Medicare Other | Admitting: Neurology

## 2015-04-18 VITALS — BP 106/62 | HR 78 | Resp 18 | Ht 65.0 in | Wt 152.0 lb

## 2015-04-18 DIAGNOSIS — G20 Parkinson's disease: Secondary | ICD-10-CM

## 2015-04-18 MED ORDER — CARBIDOPA-LEVODOPA 25-100 MG PO TABS: 1.5000 | ORAL_TABLET | Freq: Every day | ORAL | Status: AC

## 2015-04-18 MED ORDER — RASAGILINE MESYLATE 1 MG PO TABS: 1.0000 mg | ORAL_TABLET | Freq: Every day | ORAL | Status: AC

## 2015-04-18 NOTE — Progress Notes (Signed)
Subjective:    Patient ID: NIKAI QUEST is a 81 y.o. male.  HPI     Interim history:   Mr. Bartleson is a very pleasant 81 year old right-handed gentleman with an underlying medical history of hypertension, hyperlipidemia, paroxysmal A. fib, cardiac pacemaker in situ (11/03/13 under Dr. Joylene Grapes), sick sinus syndrome, nocturnal hypoxemia, status post inguinal hernia repair bilaterally (08/12/12), cataract surgeries, eyelid surgery, and bilateral TKAs (2003 and '04), who presents for followup consultation of his advanced, right-sided predominant Parkinson's disease of over 25 years' duration, complicated by dyskinesias, anxiety, and constipation as well as cognitive complaints. He is accompanied by his oldest daughter, [name], again today. I last saw him on 12/04/2014, at which time he reported doing fairly well. He had been started on Eliquis for his A fib. He tried to stay active. He was on C/L 1 1/2 pills 5 times a day. He was found to be in A fib for about 3 hours in July 2016. This is when he was started on Eliquis. He was on Buspar 1/2 pill in the afternoons and sometimes 1/2 pill in the AM. His anxiety was better and he did not increase the BuSpar to 1 pill in the afternoon. He had noticed an increase in urinary frequency when his anxiety level increased. He had occasional constipation. Sometimes he uses glycerin suppositories up to twice a week. He had recent blood work through his primary care physician's office from 11/12/2014 showed: CBC with differential was normal with the exception of hemoglobin at 13.2. Lipid profile was excellent with total cholesterol at 119, triglycerides at 44, LDL at 61, all at goal. PSA slightly elevated at 4.38. He states that he sees a urologist regularly. TSH was normal. Urinalysis was negative with the exception of urine protein at 30. CMP was unremarkable, with the exception of BUN at 23. I did not suggest any medication changes with the exception of the  possibility of increasing BuSpar to one full pill in the afternoons if needed. In the interim, his daughter called about cannabis oil and whether I would prescribe this. I advised them that this was not an approved treatment and not proven to be effective for Parkinson's disease.  Today, 04/18/2015: He reports a recent fall which unfortunately resulted in Fifth metatarsal fracture of the right hand. I reviewed the emergency room records from 03/28/2015. X-ray in 3 views of the right hand showed: Acute nondisplaced base of fifth metacarpal fracture without dislocation. He was given a splint and referred to hand surgery. He has seen Dr. Caralyn Guile. He has a hard splint. He had some changes to his meds on 03/11/15. His Florinef was discontinued and his Coreg was changed. He had problems with these changes, including lethargy, increase in tremors, no energy and therefore, the florinef was restarted at 0.1 mg once daily and Coreg reduced back to 3.125 mg bid. He has had no other falls. Does not drink enough fluids, little water. He would like to try cannabis oil which is non-THC containing and nonprescription.   Previously:  I saw him on 09/03/2014, at which time he reported that his Sinemet was not lasting him 4 hours. In the past he had insomnia after taking Sinemet post 4 PM. He was on temazepam for sleep. He was using low-dose BuSpar for anxiety. Especially around 4 PM he noticed flareup in his anxiety consistently. He was still trying to stay active, walking 1-1/2-2 miles each day. He was not drinking enough water. He saw his cardiologist, Dr. Einar Gip  in the interim and also talked to Dr. Ganji on the phone.   I saw him on 04/30/2014, at which time he reported not sleeping well. He particularly had trouble staying asleep. He was tried on temazepam by his cardiologist but had residual sedation on even 7.5 mg of temazepam the next day. He had tried low-dose melatonin. This helped him to go to sleep but did not help  him to go back to sleep. He was suffering from significant anxiety. I suggested he continue with Sinemet and Azilect at the same dose and for anxiety I suggested we try him on BuSpar starting a low-dose with incremental increase. I also encouraged him to increase his melatonin to 3 mg and perhaps 6 mg of needed. He denied depression. I asked his daughter to monitor his driving. Motor-wise he felt stable. He did have mild dyskinesias.   I saw him on 10/25/2013, at which he reported an improved tremor on the increased dose of C/L, which was 2 pills at 7 AM, 11 AM and 4 PM. He was driving and reported no recent issues with driving, his mood, his memory or with involuntary movements. He reported occasional constipation for which he was taking an over-the-counter stool softener as needed and glycerin suppositories as needed. He takes an afternoon nap around 4 PM. I kept him on Azilect once daily and Sinemet 2 pills 3 times a day. In the interim, he had a cardiac pacemaker placed on 11/03/2013 under Dr. Allred.   I saw him on 04/27/2013, at which time I felt she had remained stable. He did have a recent set back because of a protracted upper respiratory infection but had been feeling better in that regard. I suggested he continue Sinemet 3 times a day because he did not tolerate a later dose of Sinemet because of insomnia in the past. I also suggested he continue with Azilect. He called in June 2015 requesting an increase in his Sinemet to 2 pills 3 times a day. I suggested he try this but watch for side effects such as sleepiness, lightheadedness, drop in blood pressure or recurrence of hallucinations.  He goes to bed at 10 to 10:30 PM and falls asleep quickly. He sees Dr. Ganji and has had C.Doppler, echo and has a referral to see Dr. Allred. He may need a PM, he stated. He fatigues easily. He had some stressors lately. He lost his nephew last week from lung cancer and he was like a son to the patient. He is  coping ok. Sometimes he has difficulty going back to sleep in the early morning hours when he gets up to use the bathroom around 3:30 AM.   I first met him on 10/27/2012, at which time I felt he was doing fairly well. However, I did increase his Sinemet from 3 times a day to 4 times a day scheduling. He was taking 1-1/2 pills at each dose. I suggested that he take 1-1/2 pills at 7, 11, 3 PM and 7:30 PM. I kept him on the same dose of Azilect. I also referred him for speech therapy because of his hypophonia.   He previously followed with Dr. James Love and was last seen by him on 04/29/2012, at which time Dr. Love felt that he was doing fairly well. He had some dyskinesias but had a reaction in the past on amantadine including delirium (admitted to [facility] from 7/2/-10/24/04). Dr. Love did not change his medications at the time.   He has a   25 year history of Parkinson's disease. He was on multiple different medications in the past, including Mirapex, Requip, Comtan, and had side effects. He even was hospitalized in the past because of delirium while on amantadine. He was on bromocriptine in the distant past. Sinemet was restarted in March 2008 and he denied hallucinations, nausea, diarrhea, swallowing or speech difficulties. In January 2014 his MMSE was 30, clock drawing was 4, animal fluency was 22.    His Past Medical History Is Significant For: Past Medical History  Diagnosis Date  . 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 Parkinson's disease (HCC) 03/20/1996  . Bilateral inguinal hernia (BIH) s/p lap BIH repair 08/12/2012 07/26/2012  . Trifascicular block   . Essential hypertension, benign   . Malaise and fatigue   . Hollenhorst plaque, left eye   . Hyperlipidemia   . Coarse tremors     Benign essential tremors.  . Aortic valve regurgitation   . URI (upper respiratory infection) 05/2013    lasted for most of 2 - 2 1/2 months  . Chronotropic incompetence   . A-fib (HCC)     His  Past Surgical History Is Significant For: Past Surgical History  Procedure Laterality Date  . Cataract eye surgery  08/25/1999  . Total knee arthroplasty  02/24/2002    right knee  . Replacement total knee  02/20/2003  . Revision total knee arthroplasty  08/08/2004    right knee infection  . Revision total knee arthroplasty  09/18/2004    right knee completed  . Brow lift and eyelids  03/06/2008    both eyes  . Laparoscopic inguinal hernia repair Bilateral 08/12/2012  . Hernia repair Bilateral 08/12/12  . Pacemaker insertion  11/03/2013    Biotronik dual chamber pacemaker implanted by Dr Allred for SSS  . Permanent pacemaker insertion N/A 11/03/2013    Procedure: PERMANENT PACEMAKER INSERTION;  Surgeon: James D Allred, MD;  Location: [facility] CATH [facility];  Service: Cardiovascular;  Laterality: N/A;    His Family History Is Significant For: Family History  Problem Relation Age of Onset  . Kidney disease Father   . Cancer Father     renal  . COPD Sister   . Cancer Sister 55    Breast  . Congestive Heart Failure Mother     His Social History Is Significant For: Social History   Social History  . Marital Status: Married    Spouse Name: N/A  . Number of Children: N/A  . Years of Education: N/A   Social History Main Topics  . Smoking status: Former Smoker    Quit date: 07/26/1969  . Smokeless tobacco: None  . Alcohol Use: 4.2 oz/week    7 Glasses of wine per week     Comment: wine, occasionally  . Drug Use: No  . Sexual Activity: Not Asked   Other Topics Concern  . None   Social History Narrative   Right handed, 81 year old retiree.  He previously managed a chemical plant.  He has been married for 55 years and has 5 children. He drinks about 1 cup of caffeine daily and 1 glass of wine per day. He has never used illicit drugs.  Quit tonacco in 1971.  Lives with spouse in [place].    His Allergies Are:  Allergies  Allergen Reactions  . Codeine Hives and Rash     *CODEINE-GUAIFENESIN*     *COUGH/COLD/ALLERGY*  . Guaifenesin & Derivatives Rash    *  CODEINE-GUAIFENESIN*   *COUGH/COLD/ALLERGY*  :   His Current Medications Are:  Outpatient Encounter Prescriptions as of 04/18/2015  Medication Sig  . amoxicillin (AMOXIL) 500 MG capsule Take 2,000 mg by mouth once as needed.  . busPIRone (BUSPAR) 5 MG tablet Take 1 tablet (5 mg total) by mouth 3 (three) times daily as needed.  . carbidopa-levodopa (SINEMET IR) 25-100 MG tablet TAKE 1 AND 1/2 TABLETS BY MOUTH FIVE TIMES DAILY  . carvedilol (COREG) 6.25 MG tablet TK 1 T PO  BID  . cholecalciferol (VITAMIN D) 1000 UNITS tablet Take 1,000 Units by mouth daily.  . CINNAMON PO Take 1,000 mg by mouth 2 (two) times daily.  . ELIQUIS 5 MG TABS tablet TK 1 T PO  BID WITH FOOD  . esomeprazole (NEXIUM) 40 MG capsule Take 40 mg by mouth daily at 12 noon.  . fludrocortisone (FLORINEF) 0.1 MG tablet daily.   . pravastatin (PRAVACHOL) 40 MG tablet Take 40 mg by mouth every evening.  . rasagiline (AZILECT) 1 MG TABS tablet Take 1 tablet (1 mg total) by mouth daily.  . temazepam (RESTORIL) 7.5 MG capsule at bedtime.   . vitamin B-12 (CYANOCOBALAMIN) 1000 MCG tablet Take 1,000 mcg by mouth daily.  . vitamin C (ASCORBIC ACID) 500 MG tablet Take 500 mg by mouth 2 (two) times daily.  . traMADol (ULTRAM) 50 MG tablet Take 1 tablet (50 mg total) by mouth every 6 (six) hours as needed. (Patient not taking: Reported on 04/18/2015)  . [DISCONTINUED] carvedilol (COREG) 3.125 MG tablet Take 3.125 mg by mouth once. With meal   No facility-administered encounter medications on file as of 04/18/2015.  :  Review of Systems:  Out of a complete 14 point review of systems, all are reviewed and negative with the exception of these symptoms as listed below:   Review of Systems  Neurological:       Patient would like to discuss some alternative therapies for Parkinson's Disease.  Patient fell 3 weeks ago which resulted in a broken hand.      Objective:  Neurologic Exam  Physical Exam Physical Examination:   Filed Vitals:   04/18/15 1441  BP: 106/62  Pulse: 78  Resp: 18   General Examination: The patient is a very pleasant 81 y.o. male in no acute distress. He is in good spirits and indeed less anxious appearing today.   HEENT: Normocephalic, atraumatic, pupils are equal, round and reactive to light and accommodation. He is status post right cataract repair. Extraocular tracking shows mild saccadic breakdown without nystagmus noted. There is limitation to upper gaze. There is mild decrease in eye blink rate. Hearing is intact. Face is symmetric with mild facial masking and normal facial sensation. There is a mild to at times moderate intermittent lower jaw tremor. Neck is mildly rigid with intact passive ROM. There are no carotid bruits on auscultation. Oropharynx exam reveals mild mouth dryness. No significant airway crowding is noted. Mallampati is class II. Tongue protrudes centrally and palate elevates symmetrically. There is no drooling.   Chest: is clear to auscultation without wheezing, rhonchi or crackles noted.  Heart: sounds are regular and normal without murmurs, rubs or gallops noted.   Abdomen: is soft, non-tender and non-distended with normal bowel sounds appreciated on auscultation.  Extremities: There is no pitting edema in the distal lower extremities bilaterally. Pedal pulses are intact. There are no varicose veins.  Skin: is warm and dry with no trophic changes noted. Age-related changes are noted on   the skin.   Musculoskeletal: exam reveals no obvious joint deformities, tenderness, joint swelling or erythema. He is s/p b/l TKAs and right knee is a little bit bigger than left, unchanged. Right hand is in a hard splint. Finger mobility is good on the right.  Neurologically:  Mental status: The patient is awake and alert, paying good  attention. He is able to completely provide the history. He is  oriented to: person, place, time/date, situation, day of week, month of year and year. His memory, attention, language and knowledge are fairly well-preserved.   On 04/30/2014: MMSE: 30/30, CDT 4/4, AFT: 18/min.   There is no aphasia, agnosia, apraxia or anomia. There is a mild degree of bradyphrenia. Speech is moderately hypophonic with no dysarthria noted. Mood is congruent and affect is normal.    Cranial nerves are as described above under HEENT exam. In addition, shoulder shrug is normal with equal shoulder height noted.  Motor exam: Normal bulk, and strength for age is noted. There are mild generalized dyskinesias noted, unchanged.   Tone is mildly rigid with presence of cogwheeling in the upper extremities. There is overall mild bradykinesia. There is no drift or rebound.  There is a mild resting tremor in the upper extremities and a minimal intermittent tremor in the right lower extremity.  Romberg is negative.  Reflexes are 1+ in the upper extremities, trace in the knees and absent in the ankles.    Fine motor skills exam: Finger taps are moderately impaired on the right and mildly impaired on the left. Hand movements are mildly impaired on the right and mildly impaired on the left. RAP (rapid alternating patting) is mildly impaired on the right and mildly impaired on the left. Foot taps are moderately impaired on the right and moderately impaired on the left. Foot agility (in the form of heel stomping) is moderately impaired on the right and mildly impaired on the left.    Cerebellar testing shows no dysmetria or intention tremor. There is no truncal or gait ataxia.   Sensory exam is intact to light touch in the upper and lower extremities.   Gait, station and balance: He stands up from the seated position with mild difficulty and does not need to push up with His hands. He needs no assistance. No veering to one side is noted. He is not noted to lean to the side. Posture is mildly  stooped, seems unchanged and appropriate for age. Stance is narrow-based. He walks with decrease in stride length and fairly good pace and decreased arm swing on both sides. He turns in  2 steps. He has slight insecurity with turns. Balance is mildly impaired.      Assessment and Plan:   In summary, WILTON THRALL is a very pleasant 81 year old gentleman with an underlying medical history of hypertension, hyperlipidemia, paroxysmal A. fib, cardiac pacemaker in situ, sick sinus syndrome, nocturnal hypoxemia, status post inguinal hernia repair bilaterally (08/12/12), cataract surgeries, eyelid surgery, and bilateral TKAs (2003 and '04), who presents for followup consultation of his advanced, right-sided predominant Parkinson's disease of over 25 years duration, complicated by dyskinesias, anxiety, and constipation as well as cognitive complaints. He is currently on Sinemet 1 1/2 pills 5 times a day and he is on Azilect 1 mg once daily. He has been sleeping a little better with the temazepam. For anxiety he has been using BuSpar half a pill up to tid. I suggested we increase Sinemet to one and half pills 6  times a day with an additional bedtime dose at 10 PM. He would like to try cannabis oil which is nonprescription and does not contain any THC. I advised him that I would not strongly recommend this as a treatment option but do not see any reason that would contraindicate it. As long as he continues with his prescription medication he can go ahead and try it if he wishes. He is advised to increase his water intake as he may not drink enough water or fluid in general. He had a recent fall with injury to his right fifth metacarpal bone. This is healing well. He has a follow-up with Dr. Apolonio Schneiders soon. We will continue with Azilect once daily. I refilled his Azilect and Sinemet today. I would like to see him back in 4 months, sooner if needed. I answered all her questions today and the patient and his daughter were  in agreement.  Most of my 40 minute visit today was spent in counseling and coordination of care and discussing the diagnosis of advanced Parkinson's disease, its prognosis and treatment options.`

## 2015-04-18 NOTE — Patient Instructions (Addendum)
Please increase your water intake. 6 glasses would be good.   We will increase your Sinemet to 1 1/2 pills 6 times a day, at 7, 10, 1 PM, 4 PM, 7 PM and 10 PM.   We will have to monitor for dizziness and dyskinesias.   We will see you back in 4 months.   If you wish you can try cannabis oil without prescriptions, I am not sure that it will help and I don't think it will interfere with your other meds.

## 2015-06-03 ENCOUNTER — Telehealth: Payer: Self-pay | Admitting: Neurology

## 2015-06-03 NOTE — Telephone Encounter (Signed)
Daughter Olegario Shearer [phone] called to advise, patient was seen by Dr. Einar Gip 05/15/15, patient began having symptoms of dizziness and weakness, per [facility] Friday 05/31/15 recommendation was to contact our office, Dr. Einar Gip doesn't think this is a blood pressure problem. Daughter is wondering if the BP medication is interfering with Parkinson's.

## 2015-06-04 NOTE — Telephone Encounter (Signed)
Next appt with you is not until May. What would you like me to do for the patient?

## 2015-06-04 NOTE — Telephone Encounter (Signed)
We can see him for a sooner appt. pls call patient or daughter back

## 2015-06-04 NOTE — Telephone Encounter (Signed)
Daughter reports that patient has had increased dizziness with new BP medication. States that he has not done well on BP meds in the past. She says that Dr. Einar Gip does not think it is related to BP meds. We were able to set appointment for tomorrow.

## 2015-06-05 ENCOUNTER — Ambulatory Visit: Payer: Self-pay | Admitting: Neurology

## 2015-06-11 ENCOUNTER — Ambulatory Visit (INDEPENDENT_AMBULATORY_CARE_PROVIDER_SITE_OTHER): Payer: Medicare Other | Admitting: Neurology

## 2015-06-11 ENCOUNTER — Encounter: Payer: Self-pay | Admitting: Neurology

## 2015-06-11 VITALS — BP 136/72 | HR 78 | Resp 16 | Ht 65.0 in | Wt 153.0 lb

## 2015-06-11 DIAGNOSIS — I159 Secondary hypertension, unspecified: Secondary | ICD-10-CM

## 2015-06-11 DIAGNOSIS — F419 Anxiety disorder, unspecified: Secondary | ICD-10-CM

## 2015-06-11 DIAGNOSIS — G20 Parkinson's disease: Secondary | ICD-10-CM

## 2015-06-11 NOTE — Progress Notes (Signed)
Subjective:    Patient ID: [name] is a 82 y.o. male.  HPI     Interim history:  [name] is a very pleasant 82 year old right-handed gentleman with an underlying medical history of hypertension, hyperlipidemia, paroxysmal A. fib, cardiac pacemaker in situ (11/03/13 under Dr. Joylene Grapes), sick sinus syndrome, nocturnal hypoxemia, status post inguinal hernia repair bilaterally (08/12/12), cataract surgeries, eyelid surgery, and bilateral TKAs (2003 and '04), who presents for followup consultation of his advanced, right-sided predominant Parkinson's disease of over 25 years' duration, complicated by dyskinesias, anxiety, and constipation, cognitive complaints, orthostatic hypotension, anxiety, and aging. He is accompanied by his oldest daughter, [name], again today and presents for a sooner than scheduled appointment because of blood pressure issues and blood pressure fluctuations. I last saw him on 04/18/2015, at which time he reported a recent fall which unfortunately resulted in a right-sided fifth metacarpal fracture.he went to the emergency room on 03/28/2015. X-ray of the right hand and 3 views from 03/28/2015 showed: Acute nondisplaced base of fifth metacarpal fracture without dislocation. He was given a splint and referred to hand surgery. He saw Dr. Caralyn Guile. He had a hard splint. He had some changes to his meds on 03/11/15. His Florinef was discontinued and his Coreg was changed. He had problems with these changes, including lethargy, increase in tremors, no energy and therefore, the florinef was restarted at 0.1 mg once daily and Coreg reduced back to 3.125 mg bid. He has been seeing Dr. Einar Gip. More recently, he was started on valsartan.  His daughter has been monitoring his blood pressure and brings in a log. He has had fairly consistently higher blood pressure values and some drops standing up. They have been checking his blood pressure every other day, sometimes 2 times per day, lying  down,  sitting and standing. His fludrocortisone was reduced to half a pill once daily. He wanted to try cannabis oil, which is non-THC containing over-the-counter oil that can be obtained without prescription. I told him at the time that I did not know much about it and that I would not be able to prescribe it or make recommendations about it but if he wished to try it he could on his own.   Today, 06/11/2015: He takes his first dose of C/L and valsartan at 7 AM. He has been back on a full pill of Florinef since 05/22/15. His valsartan was started some 4 weeks ago. He has intermittent anxiety. He started cannabis oil on 04/22/15 with 3 gtt bid, then 3 gtt tid. He started noticing a decrease in the dyskinesias around 04/29/15. He takes buspar 1/2 in AM and 1 at night. He has intermittent episodes of feeling anxious, more tremulous, drains, overall week, this may last for minutes or a couple of hours, has no trigger, no alleviating factors, can happen anytime of day.  Previously:    I saw him on 12/04/2014, at which time he reported doing fairly well. He had been started on Eliquis for his A fib. He tried to stay active. He was on C/L 1 1/2 pills 5 times a day. He was found to be in A fib for about 3 hours in July 2016. This is when he was started on Eliquis. He was on Buspar 1/2 pill in the afternoons and sometimes 1/2 pill in the AM. His anxiety was better and he did not increase the BuSpar to 1 pill in the afternoon. He had noticed an increase in urinary frequency when his anxiety level  increased. He had occasional constipation. Sometimes he uses glycerin suppositories up to twice a week. He had recent blood work through his primary care physician's office from 11/12/2014 showed: CBC with differential was normal with the exception of hemoglobin at 13.2. Lipid profile was excellent with total cholesterol at 119, triglycerides at 44, LDL at 61, all at goal. PSA slightly elevated at 4.38. He states that he sees a  urologist regularly. TSH was normal. Urinalysis was negative with the exception of urine protein at 30. CMP was unremarkable, with the exception of BUN at 23. I did not suggest any medication changes with the exception of the possibility of increasing BuSpar to one full pill in the afternoons if needed. In the interim, his daughter called about cannabis oil and whether I would prescribe this. I advised them that this was not an approved treatment and not proven to be effective for Parkinson's disease.  I saw him on 09/03/2014, at which time he reported that his Sinemet was not lasting him 4 hours. In the past he had insomnia after taking Sinemet post 4 PM. He was on temazepam for sleep. He was using low-dose BuSpar for anxiety. Especially around 4 PM he noticed flareup in his anxiety consistently. He was still trying to stay active, walking 1-1/2-2 miles each day. He was not drinking enough water. He saw his cardiologist, Dr. Einar Gip in the interim and also talked to Dr. Einar Gip on the phone.   I saw him on 04/30/2014, at which time he reported not sleeping well. He particularly had trouble staying asleep. He was tried on temazepam by his cardiologist but had residual sedation on even 7.5 mg of temazepam the next day. He had tried low-dose melatonin. This helped him to go to sleep but did not help him to go back to sleep. He was suffering from significant anxiety. I suggested he continue with Sinemet and Azilect at the same dose and for anxiety I suggested we try him on BuSpar starting a low-dose with incremental increase. I also encouraged him to increase his melatonin to 3 mg and perhaps 6 mg of needed. He denied depression. I asked his daughter to monitor his driving. Motor-wise he felt stable. He did have mild dyskinesias.   I saw him on 10/25/2013, at which he reported an improved tremor on the increased dose of C/L, which was 2 pills at 7 AM, 11 AM and 4 PM. He was driving and reported no recent issues  with driving, his mood, his memory or with involuntary movements. He reported occasional constipation for which he was taking an over-the-counter stool softener as needed and glycerin suppositories as needed. He takes an afternoon nap around 4 PM. I kept him on Azilect once daily and Sinemet 2 pills 3 times a day. In the interim, he had a cardiac pacemaker placed on 11/03/2013 under Dr. Rayann Heman.   I saw him on 04/27/2013, at which time I felt she had remained stable. He did have a recent set back because of a protracted upper respiratory infection but had been feeling better in that regard. I suggested he continue Sinemet 3 times a day because he did not tolerate a later dose of Sinemet because of insomnia in the past. I also suggested he continue with Azilect. He called in June 2015 requesting an increase in his Sinemet to 2 pills 3 times a day. I suggested he try this but watch for side effects such as sleepiness, lightheadedness, drop in blood pressure or recurrence of  hallucinations.  He goes to bed at 10 to 10:30 PM and falls asleep quickly. He sees Dr. Einar Gip and has had C.Doppler, echo and has a referral to see Dr. Rayann Heman. He may need a PM, he stated. He fatigues easily. He had some stressors lately. He lost his nephew last week from lung cancer and he was like a son to the patient. He is coping ok. Sometimes he has difficulty going back to sleep in the early morning hours when he gets up to use the bathroom around 3:30 AM.   I first met him on 10/27/2012, at which time I felt he was doing fairly well. However, I did increase his Sinemet from 3 times a day to 4 times a day scheduling. He was taking 1-1/2 pills at each dose. I suggested that he take 1-1/2 pills at 7, 11, 3 PM and 7:30 PM. I kept him on the same dose of Azilect. I also referred him for speech therapy because of his hypophonia.   He previously followed with Dr. Morene Antu and was last seen by him on 04/29/2012, at which time Dr. Erling Cruz felt  that he was doing fairly well. He had some dyskinesias but had a reaction in the past on amantadine including delirium (admitted to [facility] from 7/2/-10/24/04). Dr. Erling Cruz did not change his medications at the time.   He has a 25 year history of Parkinson's disease. He was on multiple different medications in the past, including Mirapex, Requip, Comtan, and had side effects. He even was hospitalized in the past because of delirium while on amantadine. He was on bromocriptine in the distant past. Sinemet was restarted in March 2008 and he denied hallucinations, nausea, diarrhea, swallowing or speech difficulties. In January 2014 his MMSE was 30, clock drawing was 4, animal fluency was 22.  His Past Medical History Is Significant For: Past Medical History  Diagnosis Date  . 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 Parkinson's disease ([place]) 03/20/1996  . Bilateral inguinal hernia (BIH) s/p lap BIH repair 08/12/2012 07/26/2012  . Trifascicular block   . Essential hypertension, benign   . Malaise and fatigue   . Hollenhorst plaque, left eye   . Hyperlipidemia   . Coarse tremors     Benign essential tremors.  . Aortic valve regurgitation   . URI (upper respiratory infection) 05/2013    lasted for most of 2 - 2 1/2 months  . Chronotropic incompetence   . A-fib [facility])     His Past Surgical History Is Significant For: Past Surgical History  Procedure Laterality Date  . Cataract eye surgery  08/25/1999  . Total knee arthroplasty  02/24/2002    right knee  . Replacement total knee  02/20/2003  . Revision total knee arthroplasty  08/08/2004    right knee infection  . Revision total knee arthroplasty  09/18/2004    right knee completed  . Brow lift and eyelids  03/06/2008    both eyes  . Laparoscopic inguinal hernia repair Bilateral 08/12/2012  . Hernia repair Bilateral 08/12/12  . Pacemaker insertion  11/03/2013    Biotronik dual chamber pacemaker implanted by Dr Rayann Heman for SSS  . Permanent  pacemaker insertion N/A 11/03/2013    Procedure: PERMANENT PACEMAKER INSERTION;  Surgeon: Coralyn Mark, MD;  Location: [facility] CATH LAB;  Service: Cardiovascular;  Laterality: N/A;    His Family History Is Significant For: Family History  Problem Relation Age of Onset  . Kidney disease  Father   . Cancer Father     renal  . COPD Sister   . Cancer Sister 62    Breast  . Congestive Heart Failure Mother     His Social History Is Significant For: Social History   Social History  . Marital Status: Married    Spouse Name: N/A  . Number of Children: N/A  . Years of Education: N/A   Social History Main Topics  . Smoking status: Former Smoker    Quit date: 07/26/1969  . Smokeless tobacco: None  . Alcohol Use: 4.2 oz/week    7 Glasses of wine per week     Comment: wine, occasionally  . Drug Use: No  . Sexual Activity: Not Asked   Other Topics Concern  . None   Social History Narrative   Right handed, 82 year old retiree.  He previously managed a Civil engineer, contracting.  He has been married for 55 years and has 5 children. He drinks about 1 cup of caffeine daily and 1 glass of wine per day. He has never used illicit drugs.  Quit tonacco in 1971.  Lives with spouse in [place].    His Allergies Are:  Allergies  Allergen Reactions  . Codeine Hives and Rash    *CODEINE-GUAIFENESIN*     *COUGH/COLD/ALLERGY*  . Guaifenesin & Derivatives Rash    *CODEINE-GUAIFENESIN*   *COUGH/COLD/ALLERGY*  :   His Current Medications Are:  Outpatient Encounter Prescriptions as of 06/11/2015  Medication Sig  . amoxicillin (AMOXIL) 500 MG capsule Take 2,000 mg by mouth once as needed.  . busPIRone (BUSPAR) 5 MG tablet Take 1 tablet (5 mg total) by mouth 3 (three) times daily as needed.  . carbidopa-levodopa (SINEMET IR) 25-100 MG tablet Take 1.5 tablets by mouth 6 (six) times daily.  . carvedilol (COREG) 6.25 MG tablet TK 1 T PO  BID  . cholecalciferol (VITAMIN D) 1000 UNITS tablet Take 1,000 Units by  mouth daily.  Marland Kitchen CINNAMON PO Take 1,000 mg by mouth 2 (two) times daily.  Marland Kitchen ELIQUIS 5 MG TABS tablet TK 1 T PO  BID WITH FOOD  . esomeprazole (NEXIUM) 40 MG capsule Take 40 mg by mouth daily at 12 noon.  . fludrocortisone (FLORINEF) 0.1 MG tablet daily.   . pravastatin (PRAVACHOL) 40 MG tablet Take 40 mg by mouth every evening.  . rasagiline (AZILECT) 1 MG TABS tablet Take 1 tablet (1 mg total) by mouth daily.  . temazepam (RESTORIL) 7.5 MG capsule at bedtime.   . valsartan (DIOVAN) 80 MG tablet TK 1 T PO  QAM  . vitamin B-12 (CYANOCOBALAMIN) 1000 MCG tablet Take 1,000 mcg by mouth daily.  . vitamin C (ASCORBIC ACID) 500 MG tablet Take 500 mg by mouth 2 (two) times daily.  . [DISCONTINUED] traMADol (ULTRAM) 50 MG tablet Take 1 tablet (50 mg total) by mouth every 6 (six) hours as needed. (Patient not taking: Reported on 04/18/2015)   No facility-administered encounter medications on file as of 06/11/2015.  :  Review of Systems:  Out of a complete 14 point review of systems, all are reviewed and negative with the exception of these symptoms as listed below:   Review of Systems  Neurological:       Daughter requested appointment. States that since patient has started Valsartin he has periods of increased weakness and tremors.     Objective:  Neurologic Exam  Physical Exam Physical Examination:   Filed Vitals:   06/11/15 1448  BP: 136/72  Pulse: 78  Resp: 16   General Examination: The patient is a very pleasant 82 y.o. male in no acute distress. He is in good spirits and mildly anxious appearing today. He has no lightheadedness upon standing, no dizzy spell, no vertigo, no anxiety spell.  HEENT: Normocephalic, atraumatic, pupils are equal, round and reactive to light and accommodation. He is status post right cataract repair. Extraocular tracking shows mild saccadic breakdown without nystagmus noted. There is limitation to upper gaze. There is mild decrease in eye blink rate. Hearing  is intact. Face is symmetric with mild facial masking and normal facial sensation. There is a mild to at times moderate intermittent lower jaw tremor. Neck is mildly rigid with intact passive ROM. There are no carotid bruits on auscultation. Oropharynx exam reveals mild mouth dryness. No significant airway crowding is noted. Mallampati is class II. Tongue protrudes centrally and palate elevates symmetrically. There is no drooling.   Chest: is clear to auscultation without wheezing, rhonchi or crackles noted.  Heart: sounds are regular and normal without murmurs, rubs or gallops noted.   Abdomen: is soft, non-tender and non-distended with normal bowel sounds appreciated on auscultation.  Extremities: There is no pitting edema in the distal lower extremities bilaterally. Pedal pulses are intact. There are no varicose veins.  Skin: is warm and dry with no trophic changes noted. Age-related changes are noted on the skin.   Musculoskeletal: exam reveals no obvious joint deformities, tenderness, joint swelling or erythema. He is s/p b/l TKAs and right knee is a little bit bigger than left, unchanged.   Neurologically:  Mental status: The patient is awake and alert, paying good  attention. He is able to completely provide the history. He is oriented to: person, place, time/date, situation, day of week, month of year and year. His memory, attention, language and knowledge are fairly well-preserved.   On 04/30/2014: MMSE: 30/30, CDT 4/4, AFT: 18/min.   There is no aphasia, agnosia, apraxia or anomia. There is a mild degree of bradyphrenia. Speech is moderately hypophonic with no dysarthria noted. Mood is congruent and affect is normal.    Cranial nerves are as described above under HEENT exam. In addition, shoulder shrug is normal with equal shoulder height noted.  Motor exam: Normal bulk, and strength for age is noted. There are mild generalized dyskinesias noted, unchanged.   Tone is mildly rigid  with presence of cogwheeling in the upper extremities. There is overall mild bradykinesia. There is no drift or rebound.  There is a mild resting tremor in the upper extremities and a minimal intermittent tremor in the right lower extremity.  Romberg is negative.  Reflexes are 1+ in the upper extremities, trace in the knees and absent in the ankles.    Fine motor skills exam: Finger taps are moderately impaired on the right and mildly impaired on the left. Hand movements are mildly impaired on the right and mildly impaired on the left. RAP (rapid alternating patting) is mildly impaired on the right and mildly impaired on the left. Foot taps are moderately impaired on the right and moderately impaired on the left. Foot agility (in the form of heel stomping) is moderately impaired on the right and mildly impaired on the left.    Cerebellar testing shows no dysmetria or intention tremor. There is no truncal or gait ataxia.   Sensory exam is intact to light touch in the upper and lower extremities.   Gait, station and balance: He stands up from the seated  position with mild difficulty and does not need to push up with His hands. He needs no assistance. No veering to one side is noted. He is not noted to lean to the side. Posture is mildly stooped, seems unchanged and appropriate for age. Stance is narrow-based. He walks with decrease in stride length and fairly good pace and decreased arm swing on both sides. He turns in 2 steps. He has slight insecurity with turns. Balance is mildly impaired.      Assessment and Plan:   In summary, CANNEN DUPRAS is a very pleasant 82 year old gentleman with an underlying medical history of hypertension, hyperlipidemia, paroxysmal A. fib, cardiac pacemaker in situ, sick sinus syndrome, nocturnal hypoxemia, status post inguinal hernia repair bilaterally (08/12/12), cataract surgeries, eyelid surgery, and bilateral TKAs (2003 and '04), who presents for followup  consultation of his advanced, right-sided predominant Parkinson's disease of over 25 years duration, complicated by dyskinesias, anxiety, blood pressure fluctuation, and constipation as well as cognitive complaints. He requested a sooner appointment because of issues with his blood pressure. He has been started on valsartan about a month ago. His fludrocortisone was decreased to half a pill but he increased it back to a whole pill. He takes it in the evening. His evening blood pressure values have been consistently high in the past few weeks. I suggested he reduce his fludrocortisone to half a pill and take it in the afternoon around 2 PM. It makes really no sense for him to take fludrocortisone in the evening after he has already checked his blood pressure and sees that it is high. I reviewed his blood pressure log that his daughter brought today. We talked about his tremor and anxiety spells. He is advised to consistently tried taking BuSpar half a pill in the morning, half a pill at lunch and 1 at night. He is furthermore advised to consider increasing BuSpar to 1 pill 3 times a day, 5 mg strength. We will keep the Azilect and Sinemet the same. He has an appointment with me for follow-up on 08/19/2015 which I asked him to keep.  He has been on the cannabis oil  over-the-counter, 3 drops 3 times a day and feels that it has helped his dyskinesias.  I answered all their questions today and the patient and his daughter were in agreement.  Most of my 30 minute visit today was spent in counseling and coordination of care and discussing the diagnosis of advanced Parkinson's disease, its prognosis and treatment options.`

## 2015-06-11 NOTE — Patient Instructions (Addendum)
We will try to reduce the Florinef to 0.1 mg 1/2 pill once daily in the afternoon, around 2 PM.   We will try to increase the buspar to 1/2 in AM, 1/2 around lunch time, and 1 at night.  We will keep the Azilect the same.

## 2015-06-21 ENCOUNTER — Telehealth: Payer: Self-pay | Admitting: Neurology

## 2015-06-21 DIAGNOSIS — G20 Parkinson's disease: Secondary | ICD-10-CM

## 2015-06-21 DIAGNOSIS — G479 Sleep disorder, unspecified: Secondary | ICD-10-CM

## 2015-06-21 NOTE — Telephone Encounter (Signed)
Pt's daughter called sts Medicare will not cover temazepam (RESTORIL) 7.5 MG capsule anymore but it will cover the 15mg  but that is too strong for pt. The alternative drugs trazodone tier 2 and belsomra tier 3, rozeram tier 4. Pt saw Dr Einar Gip yesterday 06/20/15 and he is not comfortable prescribing any of these medications as he is not knowledgeable on them. He is familiar with temazepam. Could a PA for temazepam 7.5mg  be done??

## 2015-06-24 ENCOUNTER — Ambulatory Visit: Payer: Medicare Other | Admitting: Neurology

## 2015-06-24 ENCOUNTER — Telehealth: Payer: Self-pay | Admitting: Neurology

## 2015-06-24 DIAGNOSIS — G479 Sleep disorder, unspecified: Secondary | ICD-10-CM

## 2015-06-24 DIAGNOSIS — G20 Parkinson's disease: Secondary | ICD-10-CM

## 2015-06-24 MED ORDER — CLONAZEPAM 0.5 MG PO TABS: 0.5000 mg | ORAL_TABLET | Freq: Every day | ORAL | Status: DC

## 2015-06-24 MED ORDER — CLONAZEPAM 0.5 MG PO TABS: 0.5000 mg | ORAL_TABLET | Freq: Every day | ORAL | Status: AC

## 2015-06-24 NOTE — Telephone Encounter (Signed)
We can try clonazepam 0.5 mg qHS prn sleep. pls advise patient/daughter.  Rx done

## 2015-06-24 NOTE — Telephone Encounter (Signed)
I spoke to patient and he is aware of medication change. I advised him where the Rx was sent.

## 2015-06-25 ENCOUNTER — Other Ambulatory Visit: Payer: Self-pay | Admitting: Neurology

## 2015-07-06 ENCOUNTER — Emergency Department (HOSPITAL_COMMUNITY): Payer: Medicare Other

## 2015-07-06 ENCOUNTER — Inpatient Hospital Stay (HOSPITAL_COMMUNITY)
Admission: EM | Admit: 2015-07-06 | Discharge: 2015-07-20 | DRG: 082 | Disposition: E | Payer: Medicare Other | Attending: Neurology | Admitting: Neurology

## 2015-07-06 ENCOUNTER — Encounter (HOSPITAL_COMMUNITY): Payer: Self-pay | Admitting: Emergency Medicine

## 2015-07-06 DIAGNOSIS — I619 Nontraumatic intracerebral hemorrhage, unspecified: Secondary | ICD-10-CM | POA: Diagnosis present

## 2015-07-06 DIAGNOSIS — R0602 Shortness of breath: Secondary | ICD-10-CM

## 2015-07-06 DIAGNOSIS — R791 Abnormal coagulation profile: Secondary | ICD-10-CM | POA: Diagnosis present

## 2015-07-06 DIAGNOSIS — Z7901 Long term (current) use of anticoagulants: Secondary | ICD-10-CM

## 2015-07-06 DIAGNOSIS — R4020 Unspecified coma: Secondary | ICD-10-CM | POA: Diagnosis present

## 2015-07-06 DIAGNOSIS — R55 Syncope and collapse: Secondary | ICD-10-CM | POA: Diagnosis not present

## 2015-07-06 DIAGNOSIS — S06349A Traumatic hemorrhage of right cerebrum with loss of consciousness of unspecified duration, initial encounter: Principal | ICD-10-CM | POA: Diagnosis present

## 2015-07-06 DIAGNOSIS — G935 Compression of brain: Secondary | ICD-10-CM | POA: Diagnosis not present

## 2015-07-06 DIAGNOSIS — I10 Essential (primary) hypertension: Secondary | ICD-10-CM | POA: Diagnosis present

## 2015-07-06 DIAGNOSIS — Z79899 Other long term (current) drug therapy: Secondary | ICD-10-CM

## 2015-07-06 DIAGNOSIS — J9600 Acute respiratory failure, unspecified whether with hypoxia or hypercapnia: Secondary | ICD-10-CM | POA: Diagnosis not present

## 2015-07-06 DIAGNOSIS — G250 Essential tremor: Secondary | ICD-10-CM | POA: Diagnosis present

## 2015-07-06 DIAGNOSIS — Z8249 Family history of ischemic heart disease and other diseases of the circulatory system: Secondary | ICD-10-CM

## 2015-07-06 DIAGNOSIS — Z888 Allergy status to other drugs, medicaments and biological substances status: Secondary | ICD-10-CM

## 2015-07-06 DIAGNOSIS — Z66 Do not resuscitate: Secondary | ICD-10-CM | POA: Diagnosis not present

## 2015-07-06 DIAGNOSIS — W19XXXA Unspecified fall, initial encounter: Secondary | ICD-10-CM | POA: Diagnosis present

## 2015-07-06 DIAGNOSIS — F419 Anxiety disorder, unspecified: Secondary | ICD-10-CM | POA: Diagnosis present

## 2015-07-06 DIAGNOSIS — Z85828 Personal history of other malignant neoplasm of skin: Secondary | ICD-10-CM

## 2015-07-06 DIAGNOSIS — R4189 Other symptoms and signs involving cognitive functions and awareness: Secondary | ICD-10-CM

## 2015-07-06 DIAGNOSIS — Z87891 Personal history of nicotine dependence: Secondary | ICD-10-CM

## 2015-07-06 DIAGNOSIS — Z885 Allergy status to narcotic agent status: Secondary | ICD-10-CM

## 2015-07-06 DIAGNOSIS — I351 Nonrheumatic aortic (valve) insufficiency: Secondary | ICD-10-CM | POA: Diagnosis present

## 2015-07-06 DIAGNOSIS — G20 Parkinson's disease: Secondary | ICD-10-CM | POA: Diagnosis present

## 2015-07-06 DIAGNOSIS — I482 Chronic atrial fibrillation: Secondary | ICD-10-CM | POA: Diagnosis present

## 2015-07-06 DIAGNOSIS — Z96651 Presence of right artificial knee joint: Secondary | ICD-10-CM | POA: Diagnosis present

## 2015-07-06 DIAGNOSIS — Z515 Encounter for palliative care: Secondary | ICD-10-CM | POA: Diagnosis not present

## 2015-07-06 DIAGNOSIS — E785 Hyperlipidemia, unspecified: Secondary | ICD-10-CM | POA: Diagnosis present

## 2015-07-06 DIAGNOSIS — Z950 Presence of cardiac pacemaker: Secondary | ICD-10-CM

## 2015-07-06 LAB — CBC
HEMATOCRIT: 39.8 % (ref 39.0–52.0)
HEMOGLOBIN: 13.1 g/dL (ref 13.0–17.0)
MCH: 30.9 pg (ref 26.0–34.0)
MCHC: 32.9 g/dL (ref 30.0–36.0)
MCV: 93.9 fL (ref 78.0–100.0)
Platelets: 198 10*3/uL (ref 150–400)
RBC: 4.24 MIL/uL (ref 4.22–5.81)
RDW: 13.2 % (ref 11.5–15.5)
WBC: 6.3 10*3/uL (ref 4.0–10.5)

## 2015-07-06 LAB — URINALYSIS, ROUTINE W REFLEX MICROSCOPIC
Bilirubin Urine: NEGATIVE
Glucose, UA: NEGATIVE mg/dL
Hgb urine dipstick: NEGATIVE
Ketones, ur: NEGATIVE mg/dL
Leukocytes, UA: NEGATIVE
Nitrite: NEGATIVE
PH: 7 (ref 5.0–8.0)
Protein, ur: NEGATIVE mg/dL
SPECIFIC GRAVITY, URINE: 1.02 (ref 1.005–1.030)

## 2015-07-06 LAB — BASIC METABOLIC PANEL
ANION GAP: 8 (ref 5–15)
BUN: 22 mg/dL — ABNORMAL HIGH (ref 6–20)
CALCIUM: 8.9 mg/dL (ref 8.9–10.3)
CO2: 29 mmol/L (ref 22–32)
Chloride: 100 mmol/L — ABNORMAL LOW (ref 101–111)
Creatinine, Ser: 0.98 mg/dL (ref 0.61–1.24)
GFR calc non Af Amer: >60 mL/min (ref >60)
GLUCOSE: 150 mg/dL — AB (ref 65–99)
POTASSIUM: 3.9 mmol/L (ref 3.5–5.1)
Sodium: 137 mmol/L (ref 135–145)

## 2015-07-06 LAB — PROTIME-INR
INR: 1.34 (ref 0.00–1.49)
Prothrombin Time: 16.7 seconds — ABNORMAL HIGH (ref 11.6–15.2)

## 2015-07-06 LAB — CBG MONITORING, ED: Glucose-Capillary: 111 mg/dL — ABNORMAL HIGH (ref 65–99)

## 2015-07-06 MED ORDER — SODIUM CHLORIDE 0.9 % IV SOLN
INTRAVENOUS | Status: DC
  Administered 2015-07-07: 50 mL/h via INTRAVENOUS

## 2015-07-06 MED ORDER — ACETAMINOPHEN 325 MG PO TABS
650.0000 mg | ORAL_TABLET | Freq: Once | ORAL | Status: AC
  Administered 2015-07-06: 650 mg via ORAL
  Filled 2015-07-06: qty 2

## 2015-07-06 MED ORDER — MORPHINE SULFATE (PF) 2 MG/ML IV SOLN
1.0000 mg | Freq: Once | INTRAVENOUS | Status: AC
Start: 2015-07-06 — End: 2015-07-06
  Administered 2015-07-06: 1 mg via INTRAVENOUS
  Filled 2015-07-06: qty 1

## 2015-07-06 NOTE — ED Notes (Signed)
[facility] 

## 2015-07-06 NOTE — ED Provider Notes (Signed)
CSN: [id number]     Arrival date & time 07/16/2015  1926 History   First MD Initiated Contact with Patient 07/15/2015 1942     Chief Complaint  Patient presents with  . Near Syncope     (Consider location/radiation/quality/duration/timing/severity/associated sxs/prior Treatment) The history is provided by the patient and the EMS personnel.   82 year old male unexplained sudden syncopal episode at a restaurant. Patient got up and walked to the cashier was standing there and suddenly went down. Was definitely unconscious on the way down witnessed by his wife. Patient woke up once on the ground. Didn't hit his head hard. Patient will benefit of bleeding from the posterior part of his head. Patient was feeling fine earlier today was not feeling bad when he got up to walk to the cashier. 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 Also has history of atrial fibrillation. And is on anticoagulation therapy. Patient's only complaining of a headache.    Past Medical History  Diagnosis Date  . 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 Parkinson's disease ([place]) 03/20/1996  . Bilateral inguinal hernia (BIH) s/p lap BIH repair 08/12/2012 07/26/2012  . Trifascicular block   . Essential hypertension, benign   . Malaise and fatigue   . Hollenhorst plaque, left eye   . Hyperlipidemia   . Coarse tremors     Benign essential tremors.  . Aortic valve regurgitation   . URI (upper respiratory infection) 05/2013    lasted for most of 2 - 2 1/2 months  . Chronotropic incompetence   . A-fib [facility])    Past Surgical History  Procedure Laterality Date  . Cataract eye surgery  08/25/1999  . Total knee arthroplasty  02/24/2002    right knee  . Replacement total knee  02/20/2003  . Revision total knee arthroplasty  08/08/2004    right knee infection  . Revision total knee arthroplasty  09/18/2004    right knee completed  . Brow lift and eyelids  03/06/2008    both eyes  .  Laparoscopic inguinal hernia repair Bilateral 08/12/2012  . Hernia repair Bilateral 08/12/12  . Pacemaker insertion  11/03/2013    Biotronik dual chamber pacemaker implanted by Dr Rayann Heman for SSS  . Permanent pacemaker insertion N/A 11/03/2013    Procedure: PERMANENT PACEMAKER INSERTION;  Surgeon: Coralyn Mark, MD;  Location: [facility];  Service: Cardiovascular;  Laterality: N/A;   Family History  Problem Relation Age of Onset  . Kidney disease Father   . Cancer Father     renal  . COPD Sister   . Cancer Sister 46    Breast  . Congestive Heart Failure Mother    Social History  Substance Use Topics  . Smoking status: Former Smoker    Quit date: 07/26/1969  . Smokeless tobacco: None  . Alcohol Use: No     Comment: wine, occasionally    Review of Systems  Constitutional: Negative for fever.  HENT: Negative for congestion.   Eyes: Negative for visual disturbance.  Respiratory: Negative for cough.   Cardiovascular: Negative for chest pain.  Gastrointestinal: Negative for nausea, vomiting and abdominal pain.  Musculoskeletal: Negative for back pain and neck pain.  Skin: Positive for wound.  Neurological: Positive for tremors, syncope and headaches.  Hematological: Bruises/bleeds easily.  Psychiatric/Behavioral: Negative for confusion.      Allergies  Codeine and Guaifenesin & derivatives  Home Medications   Prior to Admission medications  Medication Sig Start Date End Date Taking? Authorizing Provider  amoxicillin (AMOXIL) 500 MG capsule Take 2,000 mg by mouth once as needed (dental procedures).    Yes Historical Provider, MD  busPIRone (BUSPAR) 5 MG tablet TAKE 1 TABLET BY MOUTH THREE TIMES DAILY AS NEEDED Patient taking differently: TAKE 1 TABLET BY MOUTH THREE TIMES DAILY AS NEEDED FOR ANXIETY/TREMOR 06/26/15  Yes Star Age, MD  carbidopa-levodopa (SINEMET IR) 25-100 MG tablet Take 1.5 tablets by mouth 6 (six) times daily. Patient taking differently: Take 1.5 tablets  by mouth 6 (six) times daily. Times for doses:7am,10am,1pm,4pm,7pm,10pm 04/18/15  Yes Star Age, MD  carvedilol (COREG) 6.25 MG tablet takes 1/2 tab by mouth twice daily 03/11/15  Yes Historical Provider, MD  cholecalciferol (VITAMIN D) 1000 UNITS tablet Take 1,000 Units by mouth daily.   Yes Historical Provider, MD  CINNAMON PO Take 1,000 mg by mouth 2 (two) times daily.   Yes Historical Provider, MD  clonazePAM (KLONOPIN) 0.5 MG tablet Take 1 tablet (0.5 mg total) by mouth at bedtime. 06/24/15  Yes Star Age, MD  ELIQUIS 5 MG TABS tablet TAKES 1 TAB BY MOUTH TWICE DAILY 11/10/14  Yes Historical Provider, MD  esomeprazole (NEXIUM) 40 MG capsule Take 40 mg by mouth daily at 12 noon.   Yes Historical Provider, MD  fludrocortisone (FLORINEF) 0.1 MG tablet Take 0.05 mg by mouth daily.  04/18/14  Yes Historical Provider, MD  pravastatin (PRAVACHOL) 40 MG tablet Take 40 mg by mouth every evening.   Yes Historical Provider, MD  rasagiline (AZILECT) 1 MG TABS tablet Take 1 tablet (1 mg total) by mouth daily. 04/18/15  Yes Star Age, MD  vitamin B-12 (CYANOCOBALAMIN) 1000 MCG tablet Take 1,000 mcg by mouth daily.   Yes Historical Provider, MD  vitamin C (ASCORBIC ACID) 500 MG tablet Take 500 mg by mouth 2 (two) times daily.   Yes Historical Provider, MD   BP 153/95 mmHg  Pulse 69  Temp(Src) 97.5 F (36.4 C) (Oral)  Resp 20  SpO2 98% Physical Exam  Constitutional: He is oriented to person, place, and time. He appears well-developed and well-nourished.  HENT:  Head: Normocephalic.  Mouth/Throat: Oropharynx is clear and moist.  Patient with an abrasion to the occiput part of the head measuring about 2 x 2 centimeters. No laceration. No active bleeding.  Eyes: Conjunctivae and EOM are normal. Pupils are equal, round, and reactive to light.  Neck: Normal range of motion. Neck supple.  Cardiovascular: Normal rate, regular rhythm and normal heart sounds.   No murmur heard. Pulmonary/Chest: Effort  normal and breath sounds normal. No respiratory distress.  Abdominal: Soft. Bowel sounds are normal. There is no tenderness.  Musculoskeletal: Normal range of motion. He exhibits no tenderness.  Neurological: He is alert and oriented to person, place, and time. No cranial nerve deficit. He exhibits normal muscle tone. Coordination normal.  Baseline tremor associated with his Parkinson's.  Skin: Skin is warm.  Nursing note and vitals reviewed.   ED Course  Procedures (including critical care time) Labs Review Labs Reviewed  BASIC METABOLIC PANEL - Abnormal; Notable for the following:    Chloride 100 (*)    Glucose, Bld 150 (*)    BUN 22 (*)    All other components within normal limits  PROTIME-INR - Abnormal; Notable for the following:    Prothrombin Time 16.7 (*)    All other components within normal limits  CBC  URINALYSIS, ROUTINE W REFLEX MICROSCOPIC (NOT AT [facility])  CBG  MONITORING, ED   Results for orders placed or performed during the hospital encounter of 123456  Basic metabolic panel  Result Value Ref Range   Sodium 137 135 - 145 mmol/L   Potassium 3.9 3.5 - 5.1 mmol/L   Chloride 100 (L) 101 - 111 mmol/L   CO2 29 22 - 32 mmol/L   Glucose, Bld 150 (H) 65 - 99 mg/dL   BUN 22 (H) 6 - 20 mg/dL   Creatinine, Ser 0.98 0.61 - 1.24 mg/dL   Calcium 8.9 8.9 - 10.3 mg/dL   GFR calc non Af Amer >60 >60 mL/min   GFR calc Af Amer >60 >60 mL/min   Anion gap 8 5 - 15  CBC  Result Value Ref Range   WBC 6.3 4.0 - 10.5 K/uL   RBC 4.24 4.22 - 5.81 MIL/uL   Hemoglobin 13.1 13.0 - 17.0 g/dL   HCT 39.8 39.0 - 52.0 %   MCV 93.9 78.0 - 100.0 fL   MCH 30.9 26.0 - 34.0 pg   MCHC 32.9 30.0 - 36.0 g/dL   RDW 13.2 11.5 - 15.5 %   Platelets 198 150 - 400 K/uL  Urinalysis, Routine w reflex microscopic (not at [facility])  Result Value Ref Range   Color, Urine YELLOW YELLOW   APPearance CLEAR CLEAR   Specific Gravity, Urine 1.020 1.005 - 1.030   pH 7.0 5.0 - 8.0   Glucose, UA NEGATIVE  NEGATIVE mg/dL   Hgb urine dipstick NEGATIVE NEGATIVE   Bilirubin Urine NEGATIVE NEGATIVE   Ketones, ur NEGATIVE NEGATIVE mg/dL   Protein, ur NEGATIVE NEGATIVE mg/dL   Nitrite NEGATIVE NEGATIVE   Leukocytes, UA NEGATIVE NEGATIVE  Protime-INR  Result Value Ref Range   Prothrombin Time 16.7 (H) 11.6 - 15.2 seconds   INR 1.34 0.00 - 1.49  CBG monitoring, ED  Result Value Ref Range   Glucose-Capillary 111 (H) 65 - 99 mg/dL     Imaging Review Dg Chest 2 View  07/02/2015  CLINICAL DATA:  Acute onset of syncope.  Initial encounter. EXAM: CHEST  2 VIEW COMPARISON:  Chest radiograph performed 11/04/2013 FINDINGS: The lungs are well-aerated. Minimal left basilar atelectasis is noted. There is no evidence of pleural effusion or pneumothorax. The heart is borderline normal in size. A pacemaker is noted at the left chest wall, with leads ending at the right atrium and right ventricle. No acute osseous abnormalities are seen. IMPRESSION: Minimal left basilar atelectasis noted.  Lungs otherwise clear. Electronically Signed   By: Garald Balding M.D.   On: 07/05/2015 21:39   Ct Head Wo Contrast  06/28/2015  CLINICAL DATA:  Recent fall EXAM: CT HEAD WITHOUT CONTRAST CT CERVICAL SPINE WITHOUT CONTRAST TECHNIQUE: Multidetector CT imaging of the head and cervical spine was performed following the standard protocol without intravenous contrast. Multiplanar CT image reconstructions of the cervical spine were also generated. COMPARISON:  None. FINDINGS: CT HEAD FINDINGS No findings to suggest acute hemorrhage, acute infarction or space-occupying mass lesion are noted. A small rounded hypodensities noted in the head of the caudate nucleus on the right consistent with prior lacunar infarct. CT CERVICAL SPINE FINDINGS Seven cervical segments are well visualized. Vertebral body height is well maintained. Multilevel disc space narrowing is noted from C3-C7 with osteophytic change. Facet hypertrophic changes are noted. No  acute fracture or acute facet abnormality is noted. No soft tissue abnormality is seen. The visualized lung apices are within normal limits. IMPRESSION: CT of the head: Chronic changes without acute abnormality.  CT of the cervical spine: Chronic degenerative change without acute abnormality. Electronically Signed   By: Inez Catalina M.D.   On: 07/15/2015 20:46   Ct Cervical Spine Wo Contrast  07/04/2015  CLINICAL DATA:  Recent fall EXAM: CT HEAD WITHOUT CONTRAST CT CERVICAL SPINE WITHOUT CONTRAST TECHNIQUE: Multidetector CT imaging of the head and cervical spine was performed following the standard protocol without intravenous contrast. Multiplanar CT image reconstructions of the cervical spine were also generated. COMPARISON:  None. FINDINGS: CT HEAD FINDINGS No findings to suggest acute hemorrhage, acute infarction or space-occupying mass lesion are noted. A small rounded hypodensities noted in the head of the caudate nucleus on the right consistent with prior lacunar infarct. CT CERVICAL SPINE FINDINGS Seven cervical segments are well visualized. Vertebral body height is well maintained. Multilevel disc space narrowing is noted from C3-C7 with osteophytic change. Facet hypertrophic changes are noted. No acute fracture or acute facet abnormality is noted. No soft tissue abnormality is seen. The visualized lung apices are within normal limits. IMPRESSION: CT of the head: Chronic changes without acute abnormality. CT of the cervical spine: Chronic degenerative change without acute abnormality. Electronically Signed   By: Inez Catalina M.D.   On: 07/09/2015 20:46   I have personally reviewed and evaluated these images and lab results as part of my medical decision-making.   EKG Interpretation   Date/Time:  Saturday July 06 2015 22:15:40 EDT Ventricular Rate:  111 PR Interval:  274 QRS Duration: 150 QT Interval:  404 QTC Calculation: 549 R Axis:   -90 Text Interpretation:  Atrial fibrillation IVCD,  consider atypical RBBB  Anterolateral infarct, age indeterminate Baseline wander in lead(s) V2  Partial missing lead(s): V2 Confirmed by Gabrial Poppell  MD, Cheskel Silverio [phone]) on  06/20/2015 10:26:40 PM      MDM   Final diagnoses:  Syncope, unspecified syncope type   Patient with unexplained syncopal episode. Cardiac monitoring of his pacemaker interrogation showed no evidence of any arrhythmias. Patient has history of atrial fib is on anticoagulation therapy. Did hit the back of his head when he fell. Definitely had passed out. Head CT CT of the neck negative. EKG showed atrial fibrillation has a known history of atrial fibrillation. Followed by Dr. Royal Piedra. Discussed with Dr. Marc Morgans they will admit and observe him overnight. Back of his head has an abrasion no laceration. Chest x-ray showed no acute findings. Labs without any significant abnormalities.    Fredia Sorrow, MD 07/02/2015 0040

## 2015-07-06 NOTE — ED Notes (Signed)
Charge RN made aware of need for pacemaker interrogation

## 2015-07-06 NOTE — ED Notes (Signed)
Provider at bedside updating patient

## 2015-07-06 NOTE — ED Notes (Signed)
Admitting at bedside 

## 2015-07-06 NOTE — ED Notes (Addendum)
Pt made aware of need for urine and provided with urinal

## 2015-07-06 NOTE — H&P (Signed)
[name] is an 82 y.o. male.   Chief Complaint: Status post syncope HPI: Patient is 82 year old male with past medical history significant for hypertension, chronic atrial fibrillation, sick sinus syndrome with second-degree AV block requiring permanent pacemaker in July 2015, hyperlipidemia, anxiety disorder, history of Parkinson's disease complicated by dyskinesas degenerative joint disease came to the ER by EMS following syncopal episode patient states while waiting to pay his food bill suddenly became weak and fell backward and passed out patient did not recall any palpitations chest pain to syncopal episode. Denies any slurred speech or blurring of vision. Denies any weakness in the arms or legs Denies any seizure activity. Denies such episodes in the past. States had the nuclear stress test approximately 2 years ago prior to pacemaker which was okay and had cardiac catheterization 10+ years ago which was normal. Presently denies any complaints.  Past Medical History  Diagnosis Date  . 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 Parkinson's disease ([place]) 03/20/1996  . Bilateral inguinal hernia (BIH) s/p lap BIH repair 08/12/2012 07/26/2012  . Trifascicular block   . Essential hypertension, benign   . Malaise and fatigue   . Hollenhorst plaque, left eye   . Hyperlipidemia   . Coarse tremors     Benign essential tremors.  . Aortic valve regurgitation   . URI (upper respiratory infection) 05/2013    lasted for most of 2 - 2 1/2 months  . Chronotropic incompetence   . A-fib [facility])     Past Surgical History  Procedure Laterality Date  . Cataract eye surgery  08/25/1999  . Total knee arthroplasty  02/24/2002    right knee  . Replacement total knee  02/20/2003  . Revision total knee arthroplasty  08/08/2004    right knee infection  . Revision total knee arthroplasty  09/18/2004    right knee completed  . Brow lift and eyelids  03/06/2008    both eyes  . Laparoscopic  inguinal hernia repair Bilateral 08/12/2012  . Hernia repair Bilateral 08/12/12  . Pacemaker insertion  11/03/2013    Biotronik dual chamber pacemaker implanted by Dr Rayann Heman for SSS  . Permanent pacemaker insertion N/A 11/03/2013    Procedure: PERMANENT PACEMAKER INSERTION;  Surgeon: Coralyn Mark, MD;  Location: [facility];  Service: Cardiovascular;  Laterality: N/A;    Family History  Problem Relation Age of Onset  . Kidney disease Father   . Cancer Father     renal  . COPD Sister   . Cancer Sister 103    Breast  . Congestive Heart Failure Mother    Social History:  reports that he quit smoking about 45 years ago. He does not have any smokeless tobacco history on file. He reports that he does not drink alcohol or use illicit drugs.  Allergies:  Allergies  Allergen Reactions  . Codeine Hives and Rash    *CODEINE-GUAIFENESIN*     *COUGH/COLD/ALLERGY*  . Guaifenesin & Derivatives Rash    *CODEINE-GUAIFENESIN*   *COUGH/COLD/ALLERGY*     (Not in a hospital admission)  Results for orders placed or performed during the hospital encounter of 06/30/2015 (from the past 48 hour(s))  Basic metabolic panel     Status: Abnormal   Collection Time: 07/01/2015  7:51 PM  Result Value Ref Range   Sodium 137 135 - 145 mmol/L   Potassium 3.9 3.5 - 5.1 mmol/L   Chloride 100 (L) 101 - 111 mmol/L   CO2  29 22 - 32 mmol/L   Glucose, Bld 150 (H) 65 - 99 mg/dL   BUN 22 (H) 6 - 20 mg/dL   Creatinine, Ser 0.98 0.61 - 1.24 mg/dL   Calcium 8.9 8.9 - 10.3 mg/dL   GFR calc non Af Amer >60 >60 mL/min   GFR calc Af Amer >60 >60 mL/min    Comment: (NOTE) The eGFR has been calculated using the CKD EPI equation. This calculation has not been validated in all clinical situations. eGFR's persistently <60 mL/min signify possible Chronic Kidney Disease.    Anion gap 8 5 - 15  CBC     Status: None   Collection Time: 06/19/2015  7:51 PM  Result Value Ref Range   WBC 6.3 4.0 - 10.5 K/uL   RBC 4.24 4.22 - 5.81  MIL/uL   Hemoglobin 13.1 13.0 - 17.0 g/dL   HCT 39.8 39.0 - 52.0 %   MCV 93.9 78.0 - 100.0 fL   MCH 30.9 26.0 - 34.0 pg   MCHC 32.9 30.0 - 36.0 g/dL   RDW 13.2 11.5 - 15.5 %   Platelets 198 150 - 400 K/uL  Protime-INR     Status: Abnormal   Collection Time: 06/23/2015  7:51 PM  Result Value Ref Range   Prothrombin Time 16.7 (H) 11.6 - 15.2 seconds   INR 1.34 0.00 - 1.49  Urinalysis, Routine w reflex microscopic (not at [facility])     Status: None   Collection Time: 07/12/2015  8:16 PM  Result Value Ref Range   Color, Urine YELLOW YELLOW   APPearance CLEAR CLEAR   Specific Gravity, Urine 1.020 1.005 - 1.030   pH 7.0 5.0 - 8.0   Glucose, UA NEGATIVE NEGATIVE mg/dL   Hgb urine dipstick NEGATIVE NEGATIVE   Bilirubin Urine NEGATIVE NEGATIVE   Ketones, ur NEGATIVE NEGATIVE mg/dL   Protein, ur NEGATIVE NEGATIVE mg/dL   Nitrite NEGATIVE NEGATIVE   Leukocytes, UA NEGATIVE NEGATIVE    Comment: MICROSCOPIC NOT DONE ON URINES WITH NEGATIVE PROTEIN, BLOOD, LEUKOCYTES, NITRITE, OR GLUCOSE <1000 mg/dL.  CBG monitoring, ED     Status: Abnormal   Collection Time: 06/19/2015 11:03 PM  Result Value Ref Range   Glucose-Capillary 111 (H) 65 - 99 mg/dL   Dg Chest 2 View  06/23/2015  CLINICAL DATA:  Acute onset of syncope.  Initial encounter. EXAM: CHEST  2 VIEW COMPARISON:  Chest radiograph performed 11/04/2013 FINDINGS: The lungs are well-aerated. Minimal left basilar atelectasis is noted. There is no evidence of pleural effusion or pneumothorax. The heart is borderline normal in size. A pacemaker is noted at the left chest wall, with leads ending at the right atrium and right ventricle. No acute osseous abnormalities are seen. IMPRESSION: Minimal left basilar atelectasis noted.  Lungs otherwise clear. Electronically Signed   By: Garald Balding M.D.   On: 06/25/2015 21:39   Ct Head Wo Contrast  06/29/2015  CLINICAL DATA:  Recent fall EXAM: CT HEAD WITHOUT CONTRAST CT CERVICAL SPINE WITHOUT CONTRAST TECHNIQUE:  Multidetector CT imaging of the head and cervical spine was performed following the standard protocol without intravenous contrast. Multiplanar CT image reconstructions of the cervical spine were also generated. COMPARISON:  None. FINDINGS: CT HEAD FINDINGS No findings to suggest acute hemorrhage, acute infarction or space-occupying mass lesion are noted. A small rounded hypodensities noted in the head of the caudate nucleus on the right consistent with prior lacunar infarct. CT CERVICAL SPINE FINDINGS Seven cervical segments are well visualized. Vertebral body height  is well maintained. Multilevel disc space narrowing is noted from C3-C7 with osteophytic change. Facet hypertrophic changes are noted. No acute fracture or acute facet abnormality is noted. No soft tissue abnormality is seen. The visualized lung apices are within normal limits. IMPRESSION: CT of the head: Chronic changes without acute abnormality. CT of the cervical spine: Chronic degenerative change without acute abnormality. Electronically Signed   By: Inez Catalina M.D.   On: 07/05/2015 20:46   Ct Cervical Spine Wo Contrast  07/13/2015  CLINICAL DATA:  Recent fall EXAM: CT HEAD WITHOUT CONTRAST CT CERVICAL SPINE WITHOUT CONTRAST TECHNIQUE: Multidetector CT imaging of the head and cervical spine was performed following the standard protocol without intravenous contrast. Multiplanar CT image reconstructions of the cervical spine were also generated. COMPARISON:  None. FINDINGS: CT HEAD FINDINGS No findings to suggest acute hemorrhage, acute infarction or space-occupying mass lesion are noted. A small rounded hypodensities noted in the head of the caudate nucleus on the right consistent with prior lacunar infarct. CT CERVICAL SPINE FINDINGS Seven cervical segments are well visualized. Vertebral body height is well maintained. Multilevel disc space narrowing is noted from C3-C7 with osteophytic change. Facet hypertrophic changes are noted. No acute  fracture or acute facet abnormality is noted. No soft tissue abnormality is seen. The visualized lung apices are within normal limits. IMPRESSION: CT of the head: Chronic changes without acute abnormality. CT of the cervical spine: Chronic degenerative change without acute abnormality. Electronically Signed   By: Inez Catalina M.D.   On: 06/23/2015 20:46    Review of Systems  Constitutional: Negative for fever and chills.  Eyes: Negative for double vision.  Respiratory: Negative for sputum production and shortness of breath.   Cardiovascular: Negative for chest pain, palpitations, orthopnea, leg swelling and PND.  Gastrointestinal: Negative for nausea and vomiting.  Genitourinary: Negative for dysuria.  Neurological: Positive for dizziness, tremors and weakness. Negative for focal weakness, seizures and headaches.    Blood pressure 154/106, pulse 111, temperature 97.5 F (36.4 C), temperature source Oral, resp. rate 18, SpO2 98 %. Physical Exam  Constitutional: He is oriented to person, place, and time.  HENT:  Head: Normocephalic and atraumatic.  Eyes: Conjunctivae and EOM are normal. Left eye exhibits no discharge. No scleral icterus.  Neck: Normal range of motion. Neck supple. No JVD present. No tracheal deviation present. No thyromegaly present.  Cardiovascular:  Tachycardic S1-S2 soft there is soft systolic and diastolic murmur noted  Respiratory: Effort normal and breath sounds normal. No respiratory distress. He has no wheezes. He has no rales.  GI: Soft. He exhibits no distension. There is no tenderness. There is no rebound.  Musculoskeletal: He exhibits no edema or tenderness.  Neurological: He is alert and oriented to person, place, and time.  Resting tremors noted     Assessment/Plan Status post syncope rule out cardiac arrhythmias Uncontrolled hypertension Chronic atrial fibrillation Sick sinus syndrome/second-degree AV block status post permanent  pacemaker Hyperlipidemia Degenerative joint disease History of Parkinson's disease complicated by dyskinesis Anxiety disorder Plan As per orders Check old records Check 2-D echo/duplex of carotid   Charolette Forward, MD 07/09/2015, 11:10 PM

## 2015-07-06 NOTE — ED Notes (Signed)
[employer] representative at bedside

## 2015-07-06 NOTE — ED Notes (Signed)
Per eMS:  Pt was at a restaurant waiting to pay for his food when he had a sudden onset of generalized weakness and fell backwards.  Patient does not believe he passed out, and he can recall hitting his head.  Pt has a hematoma/abrasion to the back of his head.  Pt is on Eliquis.  Pt denies neck or back pain, but EMS placed patient on collar due to mechanism.  EMS reports pupils are barely noticeably unequal, but states pt also has a hx of cataracts.  Pt also has a hx of Parkinsons.  No neurological deficits, passed EMS stroke screen.  Pt has a Biotronik, demand pacemaker.  Pt c/o headache (2/10)

## 2015-07-07 ENCOUNTER — Observation Stay (HOSPITAL_COMMUNITY): Payer: Medicare Other

## 2015-07-07 ENCOUNTER — Encounter (HOSPITAL_COMMUNITY): Payer: PRIVATE HEALTH INSURANCE

## 2015-07-07 DIAGNOSIS — I610 Nontraumatic intracerebral hemorrhage in hemisphere, subcortical: Secondary | ICD-10-CM | POA: Diagnosis not present

## 2015-07-07 DIAGNOSIS — I615 Nontraumatic intracerebral hemorrhage, intraventricular: Secondary | ICD-10-CM

## 2015-07-07 DIAGNOSIS — G250 Essential tremor: Secondary | ICD-10-CM | POA: Diagnosis present

## 2015-07-07 DIAGNOSIS — Z888 Allergy status to other drugs, medicaments and biological substances status: Secondary | ICD-10-CM | POA: Diagnosis not present

## 2015-07-07 DIAGNOSIS — Z66 Do not resuscitate: Secondary | ICD-10-CM | POA: Diagnosis not present

## 2015-07-07 DIAGNOSIS — R4020 Unspecified coma: Secondary | ICD-10-CM | POA: Diagnosis present

## 2015-07-07 DIAGNOSIS — F419 Anxiety disorder, unspecified: Secondary | ICD-10-CM | POA: Diagnosis present

## 2015-07-07 DIAGNOSIS — I482 Chronic atrial fibrillation: Secondary | ICD-10-CM | POA: Diagnosis present

## 2015-07-07 DIAGNOSIS — Z8249 Family history of ischemic heart disease and other diseases of the circulatory system: Secondary | ICD-10-CM | POA: Diagnosis not present

## 2015-07-07 DIAGNOSIS — E785 Hyperlipidemia, unspecified: Secondary | ICD-10-CM | POA: Diagnosis present

## 2015-07-07 DIAGNOSIS — G935 Compression of brain: Secondary | ICD-10-CM | POA: Diagnosis not present

## 2015-07-07 DIAGNOSIS — Z515 Encounter for palliative care: Secondary | ICD-10-CM | POA: Diagnosis not present

## 2015-07-07 DIAGNOSIS — Z885 Allergy status to narcotic agent status: Secondary | ICD-10-CM | POA: Diagnosis not present

## 2015-07-07 DIAGNOSIS — G20 Parkinson's disease: Secondary | ICD-10-CM | POA: Diagnosis present

## 2015-07-07 DIAGNOSIS — J9601 Acute respiratory failure with hypoxia: Secondary | ICD-10-CM | POA: Diagnosis not present

## 2015-07-07 DIAGNOSIS — I619 Nontraumatic intracerebral hemorrhage, unspecified: Secondary | ICD-10-CM | POA: Diagnosis present

## 2015-07-07 DIAGNOSIS — R55 Syncope and collapse: Secondary | ICD-10-CM

## 2015-07-07 DIAGNOSIS — J9600 Acute respiratory failure, unspecified whether with hypoxia or hypercapnia: Secondary | ICD-10-CM | POA: Diagnosis not present

## 2015-07-07 DIAGNOSIS — Z7901 Long term (current) use of anticoagulants: Secondary | ICD-10-CM | POA: Diagnosis not present

## 2015-07-07 DIAGNOSIS — Z85828 Personal history of other malignant neoplasm of skin: Secondary | ICD-10-CM | POA: Diagnosis not present

## 2015-07-07 DIAGNOSIS — S06349A Traumatic hemorrhage of right cerebrum with loss of consciousness of unspecified duration, initial encounter: Secondary | ICD-10-CM | POA: Diagnosis present

## 2015-07-07 DIAGNOSIS — W19XXXA Unspecified fall, initial encounter: Secondary | ICD-10-CM | POA: Diagnosis present

## 2015-07-07 DIAGNOSIS — Z950 Presence of cardiac pacemaker: Secondary | ICD-10-CM | POA: Diagnosis not present

## 2015-07-07 DIAGNOSIS — I10 Essential (primary) hypertension: Secondary | ICD-10-CM | POA: Diagnosis present

## 2015-07-07 DIAGNOSIS — Z79899 Other long term (current) drug therapy: Secondary | ICD-10-CM | POA: Diagnosis not present

## 2015-07-07 DIAGNOSIS — R791 Abnormal coagulation profile: Secondary | ICD-10-CM | POA: Diagnosis present

## 2015-07-07 DIAGNOSIS — I351 Nonrheumatic aortic (valve) insufficiency: Secondary | ICD-10-CM | POA: Diagnosis present

## 2015-07-07 DIAGNOSIS — Z87891 Personal history of nicotine dependence: Secondary | ICD-10-CM | POA: Diagnosis not present

## 2015-07-07 DIAGNOSIS — Z96651 Presence of right artificial knee joint: Secondary | ICD-10-CM | POA: Diagnosis present

## 2015-07-07 LAB — CBC WITH DIFFERENTIAL/PLATELET
BASOS ABS: 0 10*3/uL (ref 0.0–0.1)
BASOS PCT: 0 %
EOS PCT: 0 %
Eosinophils Absolute: 0 10*3/uL (ref 0.0–0.7)
HCT: 41.7 % (ref 39.0–52.0)
Hemoglobin: 14.1 g/dL (ref 13.0–17.0)
Lymphocytes Relative: 12 %
Lymphs Abs: 1.7 10*3/uL (ref 0.7–4.0)
MCH: 31.1 pg (ref 26.0–34.0)
MCHC: 33.8 g/dL (ref 30.0–36.0)
MCV: 92.1 fL (ref 78.0–100.0)
MONO ABS: 1.1 10*3/uL — AB (ref 0.1–1.0)
Monocytes Relative: 7 %
Neutro Abs: 11.4 10*3/uL — ABNORMAL HIGH (ref 1.7–7.7)
Neutrophils Relative %: 81 %
PLATELETS: 210 10*3/uL (ref 150–400)
RBC: 4.53 MIL/uL (ref 4.22–5.81)
RDW: 13.1 % (ref 11.5–15.5)
WBC: 14.3 10*3/uL — ABNORMAL HIGH (ref 4.0–10.5)

## 2015-07-07 LAB — COMPREHENSIVE METABOLIC PANEL
ALK PHOS: 64 U/L (ref 38–126)
ALT: 9 U/L — ABNORMAL LOW (ref 17–63)
ANION GAP: 14 (ref 5–15)
AST: 21 U/L (ref 15–41)
Albumin: 4.3 g/dL (ref 3.5–5.0)
BILIRUBIN TOTAL: 0.8 mg/dL (ref 0.3–1.2)
BUN: 18 mg/dL (ref 6–20)
CALCIUM: 9.4 mg/dL (ref 8.9–10.3)
CO2: 25 mmol/L (ref 22–32)
Chloride: 99 mmol/L — ABNORMAL LOW (ref 101–111)
Creatinine, Ser: 0.88 mg/dL (ref 0.61–1.24)
GFR calc non Af Amer: >60 mL/min (ref >60)
Glucose, Bld: 167 mg/dL — ABNORMAL HIGH (ref 65–99)
Potassium: 3.9 mmol/L (ref 3.5–5.1)
SODIUM: 138 mmol/L (ref 135–145)
TOTAL PROTEIN: 7.1 g/dL (ref 6.5–8.1)

## 2015-07-07 LAB — GLUCOSE, CAPILLARY
GLUCOSE-CAPILLARY: 142 mg/dL — AB (ref 65–99)
Glucose-Capillary: 128 mg/dL — ABNORMAL HIGH (ref 65–99)

## 2015-07-07 LAB — MAGNESIUM: MAGNESIUM: 1.8 mg/dL (ref 1.7–2.4)

## 2015-07-07 LAB — LIPID PANEL
Cholesterol: 149 mg/dL (ref 0–200)
HDL: 46 mg/dL (ref >40)
LDL Cholesterol: 85 mg/dL (ref 0–99)
TRIGLYCERIDES: 90 mg/dL (ref <150)
Total CHOL/HDL Ratio: 3.2 RATIO
VLDL: 18 mg/dL (ref 0–40)

## 2015-07-07 LAB — TSH: TSH: 4.302 u[IU]/mL (ref 0.350–4.500)

## 2015-07-07 LAB — TROPONIN I

## 2015-07-07 MED ORDER — METOPROLOL TARTRATE 1 MG/ML IV SOLN: 2.5000 mg | Freq: Four times a day (QID) | INTRAVENOUS | Status: DC

## 2015-07-07 MED ORDER — INSULIN ASPART 100 UNIT/ML ~~LOC~~ SOLN: 0.0000 [IU] | Freq: Three times a day (TID) | SUBCUTANEOUS | Status: DC

## 2015-07-07 MED ORDER — NALOXONE HCL 0.4 MG/ML IJ SOLN
INTRAMUSCULAR | Status: AC
  Administered 2015-07-07: 0.4 mg
  Filled 2015-07-07: qty 1

## 2015-07-07 MED ORDER — NALOXONE HCL 0.4 MG/ML IJ SOLN: 0.4000 mg | INTRAMUSCULAR | Status: DC | PRN

## 2015-07-07 MED ORDER — CARBIDOPA-LEVODOPA 25-100 MG PO TABS
1.5000 | ORAL_TABLET | Freq: Every day | ORAL | Status: DC
Start: 2015-07-07 — End: 2015-07-07
  Administered 2015-07-07: 1.5 via ORAL
  Filled 2015-07-07 (×3): qty 1.5

## 2015-07-07 MED ORDER — NITROGLYCERIN 0.4 MG SL SUBL: 0.4000 mg | SUBLINGUAL_TABLET | SUBLINGUAL | Status: DC | PRN

## 2015-07-07 MED ORDER — RASAGILINE MESYLATE 1 MG PO TABS
1.0000 mg | ORAL_TABLET | Freq: Every day | ORAL | Status: DC
  Filled 2015-07-07: qty 1

## 2015-07-07 MED ORDER — ASPIRIN 300 MG RE SUPP: 300.0000 mg | RECTAL | Status: AC

## 2015-07-07 MED ORDER — BUSPIRONE HCL 5 MG PO TABS
5.0000 mg | ORAL_TABLET | Freq: Every morning | ORAL | Status: DC
  Filled 2015-07-07: qty 1

## 2015-07-07 MED ORDER — VITAMIN B-12 1000 MCG PO TABS
1000.0000 ug | ORAL_TABLET | Freq: Every day | ORAL | Status: DC
  Filled 2015-07-07: qty 1

## 2015-07-07 MED ORDER — ASPIRIN 81 MG PO CHEW
324.0000 mg | CHEWABLE_TABLET | ORAL | Status: AC
  Administered 2015-07-07: 324 mg via ORAL
  Filled 2015-07-07: qty 4

## 2015-07-07 MED ORDER — GLYCOPYRROLATE 0.2 MG/ML IJ SOLN
0.2000 mg | INTRAMUSCULAR | Status: DC | PRN
  Administered 2015-07-07: 0.2 mg via INTRAVENOUS
  Filled 2015-07-07: qty 1

## 2015-07-07 MED ORDER — ASPIRIN EC 81 MG PO TBEC: 81.0000 mg | DELAYED_RELEASE_TABLET | Freq: Every day | ORAL | Status: DC

## 2015-07-07 MED ORDER — ACETAMINOPHEN 325 MG PO TABS
650.0000 mg | ORAL_TABLET | ORAL | Status: DC | PRN
  Administered 2015-07-07: 650 mg via ORAL
  Filled 2015-07-07: qty 2

## 2015-07-07 MED ORDER — PANTOPRAZOLE SODIUM 40 MG PO TBEC: 40.0000 mg | DELAYED_RELEASE_TABLET | Freq: Every day | ORAL | Status: DC

## 2015-07-07 MED ORDER — PRAVASTATIN SODIUM 40 MG PO TABS: 40.0000 mg | ORAL_TABLET | Freq: Every evening | ORAL | Status: DC

## 2015-07-07 MED ORDER — VITAMIN C 500 MG PO TABS: 500.0000 mg | ORAL_TABLET | Freq: Two times a day (BID) | ORAL | Status: DC

## 2015-07-07 MED ORDER — MORPHINE SULFATE (PF) 2 MG/ML IV SOLN: 1.0000 mg | INTRAVENOUS | Status: DC | PRN

## 2015-07-07 MED ORDER — APIXABAN 5 MG PO TABS: 2.5000 mg | ORAL_TABLET | Freq: Two times a day (BID) | ORAL | Status: DC

## 2015-07-07 MED ORDER — ONDANSETRON HCL 4 MG/2ML IJ SOLN: 4.0000 mg | Freq: Four times a day (QID) | INTRAMUSCULAR | Status: DC | PRN

## 2015-07-07 MED ORDER — VITAMIN D 1000 UNITS PO TABS: 1000.0000 [IU] | ORAL_TABLET | Freq: Every day | ORAL | Status: DC

## 2015-07-07 MED ORDER — GLYCOPYRROLATE 0.2 MG/ML IJ SOLN: 0.2000 mg | INTRAMUSCULAR | Status: DC

## 2015-07-07 MED ORDER — METOPROLOL TARTRATE 25 MG PO TABS
25.0000 mg | ORAL_TABLET | Freq: Two times a day (BID) | ORAL | Status: DC
  Administered 2015-07-07: 25 mg via ORAL
  Filled 2015-07-07: qty 1

## 2015-07-07 NOTE — Progress Notes (Signed)
Time of death 53. Pronounced by Reginold Agent, RN and CJ, RN. Family at the bedside. Dr. Wendee Beavers, neurology on call, notified.  HCPOA at bedside.

## 2015-07-07 NOTE — Consult Note (Signed)
PULMONARY / CRITICAL CARE MEDICINE   Name: [name] MRN: [id number] DOB: 1926/11/01    ADMISSION DATE:  06/27/2015 CONSULTATION DATE:  07/18/2015  REFERRING MD:  Wendee Beavers   CHIEF COMPLAINT:  Unresponsiveness  HISTORY OF PRESENT ILLNESS:   82 yo man, hx A Fib, HTN, pacer for AV block, Parkinson's. He was at a restaurant 3/18 pm when he experienced syncope. He was responsive afterwards. Admitted for evaluation. He progressively lost his MS and became obtunded. Head CT scan on presentstion unremarkable, but then repeat CT after he clinically changed showed a large R intraparenchymal bleed with 28mm shift with effacement of cisterns concerning for herniation. PCCM called to assess due to his obtundation.   PAST MEDICAL HISTORY :  He  has a past medical history of Skin cancer (05/06/1992); Parkinson's disease ([place]) (03/20/1996); Bilateral inguinal hernia (BIH) s/p lap BIH repair 08/12/2012 (07/26/2012); Trifascicular block; Essential hypertension, benign; Malaise and fatigue; Hollenhorst plaque, left eye; Hyperlipidemia; Coarse tremors; Aortic valve regurgitation; URI (upper respiratory infection) (05/2013); Chronotropic incompetence; and A-fib ([place]).  PAST SURGICAL HISTORY: He  has past surgical history that includes cataract eye surgery (08/25/1999); Total knee arthroplasty (02/24/2002); Replacement total knee (02/20/2003); Revision total knee arthroplasty (08/08/2004); Revision total knee arthroplasty (09/18/2004); brow lift and eyelids (03/06/2008); Laparoscopic inguinal hernia repair (Bilateral, 08/12/2012); Hernia repair (Bilateral, 08/12/12); Pacemaker insertion (11/03/2013); and permanent pacemaker insertion (N/A, 11/03/2013).  Allergies  Allergen Reactions  . Codeine Hives and Rash    *CODEINE-GUAIFENESIN*     *COUGH/COLD/ALLERGY*  . Guaifenesin & Derivatives Rash    *CODEINE-GUAIFENESIN*   *COUGH/COLD/ALLERGY*    No current facility-administered medications on file prior to encounter.   Current  Outpatient Prescriptions on File Prior to Encounter  Medication Sig  . amoxicillin (AMOXIL) 500 MG capsule Take 2,000 mg by mouth once as needed (dental procedures).   . busPIRone (BUSPAR) 5 MG tablet TAKE 1 TABLET BY MOUTH THREE TIMES DAILY AS NEEDED (Patient taking differently: TAKE 1 TABLET BY MOUTH THREE TIMES DAILY AS NEEDED FOR ANXIETY/TREMOR)  . carbidopa-levodopa (SINEMET IR) 25-100 MG tablet Take 1.5 tablets by mouth 6 (six) times daily. (Patient taking differently: Take 1.5 tablets by mouth 6 (six) times daily. Times for doses:7am,10am,1pm,4pm,7pm,10pm)  . carvedilol (COREG) 6.25 MG tablet takes 1/2 tab by mouth twice daily  . cholecalciferol (VITAMIN D) 1000 UNITS tablet Take 1,000 Units by mouth daily.  Marland Kitchen CINNAMON PO Take 1,000 mg by mouth 2 (two) times daily.  . clonazePAM (KLONOPIN) 0.5 MG tablet Take 1 tablet (0.5 mg total) by mouth at bedtime.  Marland Kitchen ELIQUIS 5 MG TABS tablet TAKES 1 TAB BY MOUTH TWICE DAILY  . esomeprazole (NEXIUM) 40 MG capsule Take 40 mg by mouth daily at 12 noon.  . fludrocortisone (FLORINEF) 0.1 MG tablet Take 0.05 mg by mouth daily.   . pravastatin (PRAVACHOL) 40 MG tablet Take 40 mg by mouth every evening.  . rasagiline (AZILECT) 1 MG TABS tablet Take 1 tablet (1 mg total) by mouth daily.  . vitamin B-12 (CYANOCOBALAMIN) 1000 MCG tablet Take 1,000 mcg by mouth daily.  . vitamin C (ASCORBIC ACID) 500 MG tablet Take 500 mg by mouth 2 (two) times daily.    FAMILY HISTORY:  His indicated that his mother is deceased. He indicated that his father is deceased. He indicated that his sister is alive.   SOCIAL HISTORY: He  reports that he quit smoking about 45 years ago. He does not have any smokeless tobacco history on file. He reports that he  does not drink alcohol or use illicit drugs.  REVIEW OF SYSTEMS:   Unable to obtain  SUBJECTIVE:  Unable to obtain  VITAL SIGNS: BP 159/90 mmHg  Pulse 110  Temp(Src) 97.8 F (36.6 C) (Oral)  Resp 18  SpO2  97%  HEMODYNAMICS:    VENTILATOR SETTINGS:    INTAKE / OUTPUT: I/O last 3 completed shifts: In: -  Out: 200 [Urine:200]  PHYSICAL EXAMINATION: General:  Elderly man, unresponsive to any stim Neuro:  Completely unresponsive to voice. Some LUE withdrawal to pain, ? Slight LLE flexion, pupils pinpoint, positive corneals HEENT:  OP clear, snoring respirations Cardiovascular:  Regular, paced, no M Lungs:  Clear B  Abdomen:  Soft, + BS Musculoskeletal: no deformities Skin:  No rash, warm  LABS:  BMET  Recent Labs Lab 07/05/2015 1951 07/19/2015 0140  NA 137 138  K 3.9 3.9  CL 100* 99*  CO2 29 25  BUN 22* 18  CREATININE 0.98 0.88  GLUCOSE 150* 167*    Electrolytes  Recent Labs Lab 06/21/2015 1951 06/27/2015 0140  CALCIUM 8.9 9.4  MG  --  1.8    CBC  Recent Labs Lab 07/12/2015 1951 06/27/2015 0140  WBC 6.3 14.3*  HGB 13.1 14.1  HCT 39.8 41.7  PLT 198 210    Coag's  Recent Labs Lab 07/11/2015 1951  INR 1.34    Sepsis Markers No results for input(s): LATICACIDVEN, PROCALCITON, O2SATVEN in the last 168 hours.  ABG No results for input(s): PHART, PCO2ART, PO2ART in the last 168 hours.  Liver Enzymes  Recent Labs Lab 07/02/2015 0140  AST 21  ALT 9*  ALKPHOS 64  BILITOT 0.8  ALBUMIN 4.3    Cardiac Enzymes  Recent Labs Lab 06/22/2015 0140  TROPONINI <0.03    Glucose  Recent Labs Lab 07/04/2015 2303 06/28/2015 0546  GLUCAP 111* 142*    Imaging Dg Chest 2 View  07/03/2015  CLINICAL DATA:  Acute onset of syncope.  Initial encounter. EXAM: CHEST  2 VIEW COMPARISON:  Chest radiograph performed 11/04/2013 FINDINGS: The lungs are well-aerated. Minimal left basilar atelectasis is noted. There is no evidence of pleural effusion or pneumothorax. The heart is borderline normal in size. A pacemaker is noted at the left chest wall, with leads ending at the right atrium and right ventricle. No acute osseous abnormalities are seen. IMPRESSION: Minimal left  basilar atelectasis noted.  Lungs otherwise clear. Electronically Signed   By: Garald Balding M.D.   On: 07/19/2015 21:39   Ct Head Wo Contrast  07/17/2015  CLINICAL DATA:  Unresponsive EXAM: CT HEAD WITHOUT CONTRAST TECHNIQUE: Contiguous axial images were obtained from the base of the skull through the vertex without intravenous contrast. COMPARISON:  07/17/2015 at 20:25 FINDINGS: There is a very large intraparenchymal hemorrhage within the right frontal lobe. There is a small right subdural hematoma in the anterior temporal region and right frontal region, extending into the interhemispheric falx. There is marked leftward midline shift, at least 15 mm. The intraparenchymal right frontal hematoma measures approximately 5 x 5 x 8 cm. There is intraventricular extension of hemorrhage. There is extension of hemorrhage into the region of the right temporal uncus, with effacement of the cisterns around the temporal uncus concerning for herniation. There is a smaller hemorrhagic contusion of the left frontal lobe which also has evolved compared to the prior study. IMPRESSION: Very large right frontal lobe intraparenchymal hemorrhage with at least 15 mm leftward midline shift and with effacement of the cisterns concerning for  herniation. Intraventricular extension of hemorrhage. Small right-sided subdural hemorrhage approximately 5 mm maximum depth, also acute. Small left frontal hemorrhagic contusion. These results were called by telephone at the time of interpretation on 07/18/2015 at 7:00 am to Dr. Nurse Zella Ball, who verbally acknowledged these results. Electronically Signed   By: Andreas Newport M.D.   On: 06/29/2015 07:06   Ct Head Wo Contrast  06/26/2015  CLINICAL DATA:  Recent fall EXAM: CT HEAD WITHOUT CONTRAST CT CERVICAL SPINE WITHOUT CONTRAST TECHNIQUE: Multidetector CT imaging of the head and cervical spine was performed following the standard protocol without intravenous contrast. Multiplanar CT image  reconstructions of the cervical spine were also generated. COMPARISON:  None. FINDINGS: CT HEAD FINDINGS No findings to suggest acute hemorrhage, acute infarction or space-occupying mass lesion are noted. A small rounded hypodensities noted in the head of the caudate nucleus on the right consistent with prior lacunar infarct. CT CERVICAL SPINE FINDINGS Seven cervical segments are well visualized. Vertebral body height is well maintained. Multilevel disc space narrowing is noted from C3-C7 with osteophytic change. Facet hypertrophic changes are noted. No acute fracture or acute facet abnormality is noted. No soft tissue abnormality is seen. The visualized lung apices are within normal limits. IMPRESSION: CT of the head: Chronic changes without acute abnormality. CT of the cervical spine: Chronic degenerative change without acute abnormality. Electronically Signed   By: Inez Catalina M.D.   On: 06/26/2015 20:46   Ct Cervical Spine Wo Contrast  07/19/2015  CLINICAL DATA:  Recent fall EXAM: CT HEAD WITHOUT CONTRAST CT CERVICAL SPINE WITHOUT CONTRAST TECHNIQUE: Multidetector CT imaging of the head and cervical spine was performed following the standard protocol without intravenous contrast. Multiplanar CT image reconstructions of the cervical spine were also generated. COMPARISON:  None. FINDINGS: CT HEAD FINDINGS No findings to suggest acute hemorrhage, acute infarction or space-occupying mass lesion are noted. A small rounded hypodensities noted in the head of the caudate nucleus on the right consistent with prior lacunar infarct. CT CERVICAL SPINE FINDINGS Seven cervical segments are well visualized. Vertebral body height is well maintained. Multilevel disc space narrowing is noted from C3-C7 with osteophytic change. Facet hypertrophic changes are noted. No acute fracture or acute facet abnormality is noted. No soft tissue abnormality is seen. The visualized lung apices are within normal limits. IMPRESSION: CT of  the head: Chronic changes without acute abnormality. CT of the cervical spine: Chronic degenerative change without acute abnormality. Electronically Signed   By: Inez Catalina M.D.   On: 07/13/2015 20:46   Dg Chest Port 1 View  07/10/2015  CLINICAL DATA:  Dyspnea. EXAM: PORTABLE CHEST 1 VIEW COMPARISON:  07/14/2015 FINDINGS: Unchanged borderline cardiomegaly. Intact appearances of the transvenous cardiac leads. No confluent alveolar opacity. No effusions. Normal pulmonary vasculature. Mild unchanged right hemidiaphragm elevation. IMPRESSION: Unchanged borderline cardiomegaly. No acute cardiopulmonary findings. Electronically Signed   By: Andreas Newport M.D.   On: 06/25/2015 06:17     STUDIES:  Head CT 3/18 >>  Head CT 3/19 >>   CULTURES:  ANTIBIOTICS:  SIGNIFICANT EVENTS: Syncopal episode 3/18 Progressed to obtundation 3/19 am  LINES/TUBES:  DISCUSSION: 82 yo man with A Fib, HTN, SSS + pacer. Suffered syncope 3/18, progressed to obtundation in the hospital. CT head showed large ICH with shift and impending herniation  ASSESSMENT / PLAN:  NEUROLOGIC A:   Coma Large ICH with impending herniation P:   No viable treatment options here due to severity of this bleed. Recommend palliation. Discussed with neurology  and Dr Terrence Dupont. Also discussed with the patient's daughter Malachy Mood by phone. We will enact comfort measures based on that discussion. Family is travelling to be with him now.   PULMONARY A: Acute respiratory failure due to altered MS P:   Would defer intubation and ventilation given overall prognosis, discomfort associated with MV.   CARDIOVASCULAR A:  HTN A Fib, SSS + pacer P:  Will defer meds for his HTN, but will use BP as a cue to treat possible discomfort.      FAMILY  - Updates: I spoke with the patient's daughter Malachy Mood by phone. Explained that this is a terminal event. She asked about any role to support him with MV to allow him to be stabilized for  distant family to arrive to see him. I have advised her against this because I want to avoid any interventions that may be hurtful to him. She voiced understanding and we agreed that he would not be intubated, that we would support him conservatively, insure his comfort.    Independent CC time 45 minutes.  Baltazar Apo, MD, PhD 06/26/2015, 7:52 AM [facility] [phone] or if no answer [phone]

## 2015-07-07 NOTE — Progress Notes (Signed)
RN was called to patient room for being unresponsive and observing a drooling on his right side of his mouth. Vital signs checked were WDL. Suctioning  Was done as well.  Rapid response nurse was called because everything that was done for patient could not get him aroused. MD paged and orders were obtained for Narcan, stat CXR and CT scan. Code stroke was also called per attending. Awaiting on Neurologist and attending MD.

## 2015-07-07 NOTE — Progress Notes (Signed)
Patient admitted into 2w08 alert and oriented. Here for observation due to a wide QRS and high HR. Patient and family oriented to the room and safety measures put in place. Call bell placed within reach. Will keep monitoring.

## 2015-07-07 NOTE — Consult Note (Signed)
Apparently family attempted to reach neurology throughout the day for discussions regarding end of life care.  I left at 8am as I was off at that point and when I returned was told family was angry because they had not been able to see any physicians at all today.  I apologized and explained that I had been off during the day and did not know the circumstances.

## 2015-07-07 NOTE — Consult Note (Signed)
Neurology Consultation Reason for Consult: stroke code Referring Physician: Floor nurse  CC: obtunded  History is obtained from: nurse and chart  HPI: [name] is a 82 y.o. male found unresponsive earlier this morning.  I was paged with stroke code when patient was already at the [facility].  CT showed massive bleed in the left side of the brain.  Unclear code status.  I called neurosurgery and CCM immediately and pt was transferred to the ICU.  Case d/w daughter who wanted time to discuss with family before providing further details about pt/family wishes.  Pt had been admitted the previous day with syncope.  He had fallen but admission CT head was negative.  Pt had hx of afib but INR was subtherapeutic on admission.   LKW: unclear tpa given?: no - ICH  ICH Score: 5  ROS:  Unable to obtain due to altered mental status.   Past Medical History  Diagnosis Date  . 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 Parkinson's disease ([place]) 03/20/1996  . Bilateral inguinal hernia (BIH) s/p lap BIH repair 08/12/2012 07/26/2012  . Trifascicular block   . Essential hypertension, benign   . Malaise and fatigue   . Hollenhorst plaque, left eye   . Hyperlipidemia   . Coarse tremors     Benign essential tremors.  . Aortic valve regurgitation   . URI (upper respiratory infection) 05/2013    lasted for most of 2 - 2 1/2 months  . Chronotropic incompetence   . A-fib [facility])     Family History  Problem Relation Age of Onset  . Kidney disease Father   . Cancer Father     renal  . COPD Sister   . Cancer Sister 70    Breast  . Congestive Heart Failure Mother     Social History:  reports that he quit smoking about 45 years ago. He does not have any smokeless tobacco history on file. He reports that he does not drink alcohol or use illicit drugs.  Exam: Current vital signs: BP 159/90 mmHg  Pulse 110  Temp(Src) 97.8 F (36.6 C) (Oral)  Resp 18  SpO2 97% Vital signs in  last 24 hours: Temp:  [97.5 F (36.4 C)-97.8 F (36.6 C)] 97.8 F (36.6 C) (03/19 0553) Pulse Rate:  [68-111] 110 (03/19 0706) Resp:  [18-24] 18 (03/19 0553) BP: (106-185)/(61-116) 159/90 mmHg (03/19 0706) SpO2:  [95 %-100 %] 97 % (03/19 0553)   Physical Exam  Constitutional: obtunded and unresponsive, posturing - not following commands  Eyes: closed - pinpoint pupils Neuro: Cranial Nerves: II: no BTT III,IV, VI: unable to test - right trigeminal intact but not left Motor: Flaccid and postures Sensory: Postures to deep pain Deep Tendon Reflexes: Flaccid no reflexes Plantars: upgoing toes bilaterally Cerebellar: Unable to test   I have reviewed the images obtained: CT head - large right sided ICH with IVH extension  Impression: massive right sided ICH - poor prognosis - d/w wife and daughter and they will be coming to provide further management decisions.  Transferred to unit and case already d/w CCM attending.  SBP goal for now < [id number] systolic.  COnsider palliative care.  Recommendations: 1) as above

## 2015-07-07 NOTE — Progress Notes (Signed)
Subjective:  Patient was found unresponsive by the nurse code stroke was called and patient subsequently repeat CT of the brain which showed massive intraparenchymal hemorrhage within the right frontal lobe midline shift with signs concerning for herniation. Patient is unresponsive and posturing and not following commands  Objective:  Vital Signs in the last 24 hours: Temp:  [97.5 F (36.4 C)-97.8 F (36.6 C)] 97.8 F (36.6 C) (03/19 0553) Pulse Rate:  [68-111] 110 (03/19 0706) Resp:  [18-24] 18 (03/19 0553) BP: (106-185)/(61-116) 159/90 mmHg (03/19 0706) SpO2:  [95 %-100 %] 97 % (03/19 0553)  Intake/Output from previous day: 03/18 0701 - 03/19 0700 In: -  Out: 200 [Urine:200] Intake/Output from this shift:    Physical Exam: Neck: no adenopathy, no carotid bruit, no JVD and supple, symmetrical, trachea midline Lungs: Clear anteriorly Heart: Tachycardic S1-S2 soft Abdomen: soft, non-tender; bowel sounds normal; no masses,  no organomegaly Extremities: extremities normal, atraumatic, no cyanosis or edema Neuro he tended and unresponsive pupils are pinpoint Lab Results:  Recent Labs  06/28/2015 1951 07/06/2015 0140  WBC 6.3 14.3*  HGB 13.1 14.1  PLT 198 210    Recent Labs  06/25/2015 1951 07/11/2015 0140  NA 137 138  K 3.9 3.9  CL 100* 99*  CO2 29 25  GLUCOSE 150* 167*  BUN 22* 18  CREATININE 0.98 0.88    Recent Labs  07/05/2015 0140  TROPONINI <0.03   Hepatic Function Panel  Recent Labs  07/10/2015 0140  PROT 7.1  ALBUMIN 4.3  AST 21  ALT 9*  ALKPHOS 64  BILITOT 0.8    Recent Labs  07/19/2015 0253  CHOL 149   No results for input(s): PROTIME in the last 72 hours.  Imaging: Imaging results have been reviewed and Dg Chest 2 View  07/12/2015  CLINICAL DATA:  Acute onset of syncope.  Initial encounter. EXAM: CHEST  2 VIEW COMPARISON:  Chest radiograph performed 11/04/2013 FINDINGS: The lungs are well-aerated. Minimal left basilar atelectasis is noted. There  is no evidence of pleural effusion or pneumothorax. The heart is borderline normal in size. A pacemaker is noted at the left chest wall, with leads ending at the right atrium and right ventricle. No acute osseous abnormalities are seen. IMPRESSION: Minimal left basilar atelectasis noted.  Lungs otherwise clear. Electronically Signed   By: Garald Balding M.D.   On: 07/02/2015 21:39   Ct Head Wo Contrast  07/10/2015  CLINICAL DATA:  Unresponsive EXAM: CT HEAD WITHOUT CONTRAST TECHNIQUE: Contiguous axial images were obtained from the base of the skull through the vertex without intravenous contrast. COMPARISON:  07/05/2015 at 20:25 FINDINGS: There is a very large intraparenchymal hemorrhage within the right frontal lobe. There is a small right subdural hematoma in the anterior temporal region and right frontal region, extending into the interhemispheric falx. There is marked leftward midline shift, at least 15 mm. The intraparenchymal right frontal hematoma measures approximately 5 x 5 x 8 cm. There is intraventricular extension of hemorrhage. There is extension of hemorrhage into the region of the right temporal uncus, with effacement of the cisterns around the temporal uncus concerning for herniation. There is a smaller hemorrhagic contusion of the left frontal lobe which also has evolved compared to the prior study. IMPRESSION: Very large right frontal lobe intraparenchymal hemorrhage with at least 15 mm leftward midline shift and with effacement of the cisterns concerning for herniation. Intraventricular extension of hemorrhage. Small right-sided subdural hemorrhage approximately 5 mm maximum depth, also acute. Small left frontal  hemorrhagic contusion. These results were called by telephone at the time of interpretation on 07/04/2015 at 7:00 am to Dr. Nurse Zella Ball, who verbally acknowledged these results. Electronically Signed   By: Andreas Newport M.D.   On: 07/01/2015 07:06   Ct Head Wo  Contrast  06/26/2015  CLINICAL DATA:  Recent fall EXAM: CT HEAD WITHOUT CONTRAST CT CERVICAL SPINE WITHOUT CONTRAST TECHNIQUE: Multidetector CT imaging of the head and cervical spine was performed following the standard protocol without intravenous contrast. Multiplanar CT image reconstructions of the cervical spine were also generated. COMPARISON:  None. FINDINGS: CT HEAD FINDINGS No findings to suggest acute hemorrhage, acute infarction or space-occupying mass lesion are noted. A small rounded hypodensities noted in the head of the caudate nucleus on the right consistent with prior lacunar infarct. CT CERVICAL SPINE FINDINGS Seven cervical segments are well visualized. Vertebral body height is well maintained. Multilevel disc space narrowing is noted from C3-C7 with osteophytic change. Facet hypertrophic changes are noted. No acute fracture or acute facet abnormality is noted. No soft tissue abnormality is seen. The visualized lung apices are within normal limits. IMPRESSION: CT of the head: Chronic changes without acute abnormality. CT of the cervical spine: Chronic degenerative change without acute abnormality. Electronically Signed   By: Inez Catalina M.D.   On: 07/15/2015 20:46   Ct Cervical Spine Wo Contrast  07/05/2015  CLINICAL DATA:  Recent fall EXAM: CT HEAD WITHOUT CONTRAST CT CERVICAL SPINE WITHOUT CONTRAST TECHNIQUE: Multidetector CT imaging of the head and cervical spine was performed following the standard protocol without intravenous contrast. Multiplanar CT image reconstructions of the cervical spine were also generated. COMPARISON:  None. FINDINGS: CT HEAD FINDINGS No findings to suggest acute hemorrhage, acute infarction or space-occupying mass lesion are noted. A small rounded hypodensities noted in the head of the caudate nucleus on the right consistent with prior lacunar infarct. CT CERVICAL SPINE FINDINGS Seven cervical segments are well visualized. Vertebral body height is well  maintained. Multilevel disc space narrowing is noted from C3-C7 with osteophytic change. Facet hypertrophic changes are noted. No acute fracture or acute facet abnormality is noted. No soft tissue abnormality is seen. The visualized lung apices are within normal limits. IMPRESSION: CT of the head: Chronic changes without acute abnormality. CT of the cervical spine: Chronic degenerative change without acute abnormality. Electronically Signed   By: Inez Catalina M.D.   On: 07/13/2015 20:46   Dg Chest Port 1 View  06/24/2015  CLINICAL DATA:  Dyspnea. EXAM: PORTABLE CHEST 1 VIEW COMPARISON:  06/21/2015 FINDINGS: Unchanged borderline cardiomegaly. Intact appearances of the transvenous cardiac leads. No confluent alveolar opacity. No effusions. Normal pulmonary vasculature. Mild unchanged right hemidiaphragm elevation. IMPRESSION: Unchanged borderline cardiomegaly. No acute cardiopulmonary findings. Electronically Signed   By: Andreas Newport M.D.   On: 07/19/2015 06:17    Cardiac Studies:  Assessment/Plan:  Massive intracerebral hemorrhage with signs of herniation Status post syncope Hypertension Chronic A. fib Sick sinus syndrome status post permanent pacemaker Hyperlipidemia Degenerative joint disease History of advanced Parkinson's disease Plan Continue supportive management for now discussed with family regarding CODE STATUS, and daughter is getting in touch with all family members before deciding final decision DC 2-D echo and carotid duplex DC Eliquis and aspirin  Hold by mouth meds Lopressor 2.5 mg IV every 6 hours when necessary  Prognosis grim      Charolette Forward 07/01/2015, 7:41 AM

## 2015-07-07 NOTE — Progress Notes (Signed)
Family left pt's belongings (clothing and shoes) at the bedside. P[facility] Bare [facility]) notified and she said that she will pick it up in the AM. Pt's belonging put is a bag with pt's name. Stored by the nursing station front desk.

## 2015-07-07 NOTE — ED Notes (Signed)
Attempted report x1. 

## 2015-07-07 NOTE — Progress Notes (Addendum)
Pt arrived unresponsive, abnormal flexion to pain on left, snoring respirations, BP elevated at 169/105.  Seizure like activity noted, rhythmic movements localized to the RUE lasting for appx 30 seconds.  MD notified of aforementioned observations, no orders at this time.  MD currently talking with family.

## 2015-07-07 NOTE — Progress Notes (Signed)
RN spoke with Cardiology, Dr. Wynonia Lawman. It is OK to leave pacemaker, no need to use magnet or page MD to turn it off.

## 2015-07-07 NOTE — Progress Notes (Signed)
Called to bedside per floor RN at [phone] for Patient "unresponsive." Per floor RN he was admitted to the floor earlier tonight alert and oriented. LSN at 0230 when RN gave his oral Sinemet, found by RN at 0530 with small amount of emesis in mouth and unresponsive. Advised RN to page MD while RRT en route. Upon my arrival at 0556 Pt found resting in bed appears sleep, pupils size 1, sluggish reaction,withdraws to pain in both hands, does not open eyes to sternal rub, withdraws to pain in bilateral lower extremities.. HR 110s BP 176/81   Po2 100% on RA, RR 12. Pt appears sleep. Pt did receive Morphine in ED at 2322, Narcan given at 0615. CXR ordered and completed. Dr. Terrence Dupont paged and updated on Pt status orders received for stat CT scan, Narcan and CXR already completed. Dr. Terrence Dupont paged again while en route to CT scan to request STAT Neuro consult, per MD to page out CODE Stroke. Code Stroke paged and Neurologist Dr. Wendee Beavers met Patient back on [place] following CT scan. Large  R intraparenchymal bleed seen per Neurologist. Neurosurgery and PCCM consulted per MD. Bed obtained on 3 M, Patient transferred to 3M11 at 0700. Care assumed per ICU RN, RRT report provided at bedside and 2 W RN via phone.

## 2015-07-08 LAB — HEMOGLOBIN A1C
Hgb A1c MFr Bld: 5.8 % — ABNORMAL HIGH (ref 4.8–5.6)
MEAN PLASMA GLUCOSE: 120 mg/dL

## 2015-07-11 ENCOUNTER — Telehealth: Payer: Self-pay | Admitting: Neurology

## 2015-07-11 NOTE — Telephone Encounter (Signed)
I talked to [name], patient's daughter, who I have seen many a time, as she was there typically for [name] appointments with me. She was obviously still distressed. She told me that funeral is today. I reviewed his chart, emergency room records, neurology consult notes, and discharge summary, imaging studies. I had a very lengthy and difficult conversation with the daughter who obviously had very valid questions as to what happened to him but I explained to her to the best of my knowledge and as best as I could that it is difficult to definitely say what happened exactly and why. In light of his advanced age, complicated medical and cardiac history and long-standing Parkinson's disease he could have had a number of sudden complications such as orthostatic hypotension, sudden bradycardia with pacemaker failure, vertigo attack, a sentinel bleed which was not immediately visible on CT scan. He fell at the restaurant and struck his head in the back as I understand. He may not have had an immediate hemorrhage but could have had a slow and seeping hemorrhage that would have not shown up on an immediate head CT. It looks to me that everything was done by the book. It is still difficult to accept and I understand that. She may not get all the answer she deserves or would like to have and it is difficult to accept that I explained. He was a very lively man and struggled for a long time with all his problems including the advancing Parkinson's disease, struggled with blood pressure fluctuations, having to be on blood thinner because of his A. fib, sick sinus syndrome with need for PM. I told her that there was nothing she could've done differently even if she had been there at the time of the fall and the initial emergency room visit. She was out of town in [place] and understandably blames herself for not being there immediately but I tried to reassure her that she did everything she could for her father in that she  was always there for the appointments as far as I can remember and that she was very caring and always very involved in his care. I remember, she had very valid questions every time, would bring logs of BP and would take diligent notes. She did mention that she called multiple times this office when he was admitted to the hospital, but there is no phone calls documented and I promised her that I would investigate this. Even though we do not cover the emergency room or hospital, I told her that we document our phone calls. She may have called the on-call doctor for the hospital. Nevertheless, I will find out. I also told her that we will touch base next week and I will call and check on her as she was in distress and she was getting ready for the funeral. He really truly was a very nice gentleman, very sweet man indeed and I will miss him. I will talk to his cardiologist, Dr. Einar Gip as well, when he is back in town.

## 2015-07-20 DEATH — deceased

## 2015-07-30 NOTE — Discharge Summary (Signed)
[name], ELIE NO.:  [id number]  MEDICAL RECORD NO.:  [id number]  LOCATION:  [id number]                        FACILITY:  [facility]  PHYSICIAN:  Lillah Standre N. Terrence Dupont, M.D. DATE OF BIRTH:  12-13-1926  DATE OF ADMISSION:  07/04/2015 DATE OF DISCHARGE:  07/06/2015                              DISCHARGE SUMMARY   DEATH SUMMARY  ADMITTING DIAGNOSES: 1. Status post syncope, rule out cardiac arrhythmias. 2. Uncontrolled hypertension. 3. Chronic atrial fibrillation. 4. Sick sinus syndrome/history of second-degree atrioventricular block     status post permanent pacemaker. 5. Hyperlipidemia. 6. Degenerative joint disease. 7. History of Parkinson disease, complicated by dyskinesis. 8. Anxiety disorder.  FINAL DIAGNOSES: 1. Massive intracerebral hemorrhage with signs of herniation. 2. Cardiopulmonary arrest. 3. Status post syncope. 4. History of hypertension. 5. History of chronic atrial fibrillation. 6. Sick sinus syndrome, status post permanent pacemaker in the past. 7. Hyperlipidemia. 8. Degenerative joint disease. 9. History of advanced Parkinson disease.  BRIEF HISTORY AND HOSPITAL COURSE:  [name] was an 82 year old male with past medical history significant for hypertension, chronic atrial fibrillation, sick sinus syndrome with second-degree AV block requiring permanent pacemaker in July of 2015, hyperlipidemia, anxiety disorder, history of Parkinson disease, complicated by dyskinesis, degenerative joint disease.  He came to the ER by EMS following syncopal episode.  The patient states while waiting to pay his food bill, suddenly became weak and fell backward and passed out.  The patient did not recall any palpitations, chest pain prior to syncopal episode. Denies any slurred speech or blurring of vision.  Denies any weakness of the arms or legs.  Denies any seizure activity.  Denies such episodes in the past.  States had nuclear stress test  approximately 2 years ago prior to the pacemaker which was okay and had cardiac catheterization, 10+ years ago which was normal.  The patient when seen in the ED denies any complaints.  PHYSICAL EXAMINATION:  GENERAL:  He was alert, awake, oriented. VITAL SIGNS:  Blood pressure was 154/106, pulse was 111.  He was afebrile. HEENT:  Conjunctivae were pink. NECK:  Supple.  No JVD.  No bruit. LUNGS:  Clear to auscultation without rhonchi or rales. CARDIOVASCULAR:  He was tachycardic, S1-S2 was soft.  There was soft systolic and diastolic murmur noted. ABDOMEN:  Soft.  Bowel sounds were present, nontender. NEUROLOGIC:  Grossly intact except for resting tremors.  LABORATORY DATA:  His labs, sodium was 137, potassium 3.9, BUN 22, creatinine 0.98.  Glucose was 150.  Hemoglobin was 13.1, hematocrit 39.8, white count of 6.3.  His PT was 16.7, INR 1.34.  CT of the brain done in the ED, CT of the head showed chronic changes without acute abnormality.  CT of the C-spine, chronic degenerative changes without acute abnormality.  His hemoglobin A1c was 5.8, TSH was 4.30.  Chest x- ray showed minimal left basilar atelectasis.  Lungs otherwise were clear.  Repeat CT of the brain done on March 19, showed very large right frontal lobe intraparenchymal hemorrhage with at least 15 mm leftward midline shift and with effacement of the cistern concerning for herniation, intraventricular extension of hemorrhage, small right-sided subdural hemorrhage approximately 5 mm maximum depth, also acute  small left frontal hemorrhage, contusion was also noted.  BRIEF HOSPITAL COURSE:  The patient was admitted to the telemetry unit on 07-25-2022.  The patient suddenly became unresponsive and was noted by the nurse drooling on the right side.  Rapid response nurse was called as patient could not be arose.  Patient's code stroke was called and patient underwent emergency CT, repeat CT of the brain which showed extensive  bleed as above.  The patient was seen by [place] also and was felt that not the candidate for any acute interventions in view of massive intracerebral bleed with evidence of herniation.  Discussed with patient's daughter over the phone regarding code status.  Daughter stated that she is getting in touch with all family members before making final decision.  This information was relayed to CCM, Dr. Lamonte Sakai, who also discussed with family over the phone regarding his critical condition and very grim prognosis.  The patient and family agreed for DNR and comfort measures.  The patient was pronounced dead at 5:35 p.m. on 25-Jul-2015.  The patient expired on 2015-07-25, at 5:35 p.m.  Apparently the patient's family attempted to reach Neurology throughout the day. Further discussion regarding end-of-life care and we were not able to reach Neurology on-call because the pager went to round neurologist on- call as per chart review.     Allegra Lai. Terrence Dupont, M.D.     M[facility]/MEDQ  D:  07/30/2015  T:  07/30/2015  Job:  [id number]

## 2015-08-19 ENCOUNTER — Ambulatory Visit: Payer: Medicare Other | Admitting: Neurology

## 2015-12-06 NOTE — Telephone Encounter (Signed)
No additional note. Tried to close encounter, but it opened chart.
